# Patient Record
Sex: Female | Born: 1950 | Race: White | Hispanic: No | Marital: Married | Smoking: Never smoker
Health system: Southern US, Community
[De-identification: ages and names within clinical notes are randomized; demographics above are authoritative.]

## PROBLEM LIST (undated history)

## (undated) DIAGNOSIS — K5190 Ulcerative colitis, unspecified, without complications: Secondary | ICD-10-CM

## (undated) DIAGNOSIS — F329 Major depressive disorder, single episode, unspecified: Secondary | ICD-10-CM

## (undated) DIAGNOSIS — M1990 Unspecified osteoarthritis, unspecified site: Secondary | ICD-10-CM

## (undated) DIAGNOSIS — K635 Polyp of colon: Secondary | ICD-10-CM

## (undated) DIAGNOSIS — I10 Essential (primary) hypertension: Secondary | ICD-10-CM

## (undated) DIAGNOSIS — K219 Gastro-esophageal reflux disease without esophagitis: Secondary | ICD-10-CM

## (undated) DIAGNOSIS — F419 Anxiety disorder, unspecified: Secondary | ICD-10-CM

## (undated) DIAGNOSIS — E7800 Pure hypercholesterolemia, unspecified: Secondary | ICD-10-CM

## (undated) DIAGNOSIS — D649 Anemia, unspecified: Secondary | ICD-10-CM

## (undated) DIAGNOSIS — R519 Headache, unspecified: Secondary | ICD-10-CM

## (undated) DIAGNOSIS — T8859XA Other complications of anesthesia, initial encounter: Secondary | ICD-10-CM

## (undated) DIAGNOSIS — G8929 Other chronic pain: Secondary | ICD-10-CM

## (undated) DIAGNOSIS — F32A Depression, unspecified: Secondary | ICD-10-CM

## (undated) HISTORY — DX: Ulcerative colitis, unspecified, without complications: K51.90

## (undated) HISTORY — PX: OTHER SURGICAL HISTORY: SHX169

## (undated) HISTORY — DX: Other chronic pain: G89.29

## (undated) HISTORY — PX: TONSILLECTOMY: SUR1361

## (undated) HISTORY — DX: Polyp of colon: K63.5

## (undated) HISTORY — DX: Anemia, unspecified: D64.9

## (undated) HISTORY — DX: Unspecified osteoarthritis, unspecified site: M19.90

## (undated) HISTORY — PX: SHOULDER SURGERY: SHX246

## (undated) HISTORY — PX: ABDOMINAL HYSTERECTOMY: SHX81

## (undated) HISTORY — DX: Headache, unspecified: R51.9

---

## 1979-11-16 HISTORY — PX: APPENDECTOMY: SHX54

## 1999-11-23 ENCOUNTER — Other Ambulatory Visit: Admission: RE | Admit: 1999-11-23 | Discharge: 1999-11-23 | Payer: Self-pay | Admitting: Obstetrics & Gynecology

## 2000-12-19 ENCOUNTER — Other Ambulatory Visit: Admission: RE | Admit: 2000-12-19 | Discharge: 2000-12-19 | Payer: Self-pay | Admitting: Obstetrics & Gynecology

## 2002-03-19 ENCOUNTER — Other Ambulatory Visit: Admission: RE | Admit: 2002-03-19 | Discharge: 2002-03-19 | Payer: Self-pay | Admitting: Obstetrics & Gynecology

## 2002-04-02 ENCOUNTER — Encounter: Admission: RE | Admit: 2002-04-02 | Discharge: 2002-04-02 | Payer: Self-pay | Admitting: Family Medicine

## 2002-04-02 ENCOUNTER — Encounter: Payer: Self-pay | Admitting: Family Medicine

## 2003-04-29 ENCOUNTER — Other Ambulatory Visit: Admission: RE | Admit: 2003-04-29 | Discharge: 2003-04-29 | Payer: Self-pay | Admitting: Obstetrics & Gynecology

## 2004-09-28 ENCOUNTER — Other Ambulatory Visit: Admission: RE | Admit: 2004-09-28 | Discharge: 2004-09-28 | Payer: Self-pay | Admitting: Obstetrics and Gynecology

## 2005-03-29 ENCOUNTER — Encounter: Admission: RE | Admit: 2005-03-29 | Discharge: 2005-03-29 | Payer: Self-pay | Admitting: Family Medicine

## 2005-08-23 ENCOUNTER — Encounter: Admission: RE | Admit: 2005-08-23 | Discharge: 2005-08-23 | Payer: Self-pay | Admitting: Family Medicine

## 2005-08-23 ENCOUNTER — Ambulatory Visit: Payer: Self-pay | Admitting: Emergency Medicine

## 2005-08-30 ENCOUNTER — Encounter: Admission: RE | Admit: 2005-08-30 | Discharge: 2005-08-30 | Payer: Self-pay | Admitting: Family Medicine

## 2005-09-20 ENCOUNTER — Ambulatory Visit: Payer: Self-pay | Admitting: Emergency Medicine

## 2005-10-11 ENCOUNTER — Ambulatory Visit: Payer: Self-pay | Admitting: Emergency Medicine

## 2005-12-06 ENCOUNTER — Ambulatory Visit: Payer: Self-pay | Admitting: Emergency Medicine

## 2006-08-29 ENCOUNTER — Encounter: Admission: RE | Admit: 2006-08-29 | Discharge: 2006-08-29 | Payer: Self-pay | Admitting: Obstetrics & Gynecology

## 2007-03-18 ENCOUNTER — Encounter: Admission: RE | Admit: 2007-03-18 | Discharge: 2007-03-18 | Payer: Self-pay | Admitting: Family Medicine

## 2007-11-27 ENCOUNTER — Encounter: Admission: RE | Admit: 2007-11-27 | Discharge: 2007-11-27 | Payer: Self-pay | Admitting: Family Medicine

## 2007-12-04 ENCOUNTER — Encounter: Admission: RE | Admit: 2007-12-04 | Discharge: 2007-12-04 | Payer: Self-pay | Admitting: Family Medicine

## 2009-01-14 ENCOUNTER — Inpatient Hospital Stay (HOSPITAL_COMMUNITY): Admission: EM | Admit: 2009-01-14 | Discharge: 2009-01-17 | Payer: Self-pay | Admitting: Emergency Medicine

## 2009-01-15 ENCOUNTER — Encounter (INDEPENDENT_AMBULATORY_CARE_PROVIDER_SITE_OTHER): Payer: Self-pay | Admitting: Emergency Medicine

## 2009-01-15 ENCOUNTER — Ambulatory Visit: Payer: Self-pay | Admitting: Surgery

## 2009-06-09 ENCOUNTER — Encounter: Admission: RE | Admit: 2009-06-09 | Discharge: 2009-06-09 | Payer: Self-pay | Admitting: Obstetrics & Gynecology

## 2011-02-25 LAB — COMPREHENSIVE METABOLIC PANEL
ALT: 21 U/L (ref 0–35)
Albumin: 3.2 g/dL — ABNORMAL LOW (ref 3.5–5.2)
Albumin: 3.6 g/dL (ref 3.5–5.2)
Alkaline Phosphatase: 54 U/L (ref 39–117)
Alkaline Phosphatase: 58 U/L (ref 39–117)
BUN: 10 mg/dL (ref 6–23)
BUN: 10 mg/dL (ref 6–23)
Calcium: 8.5 mg/dL (ref 8.4–10.5)
Chloride: 95 mEq/L — ABNORMAL LOW (ref 96–112)
Creatinine, Ser: 0.85 mg/dL (ref 0.4–1.2)
GFR calc non Af Amer: >60 mL/min (ref >60)
Glucose, Bld: 89 mg/dL (ref 70–99)
Potassium: 3.5 mEq/L (ref 3.5–5.1)
Sodium: 136 mEq/L (ref 135–145)
Total Bilirubin: 0.4 mg/dL (ref 0.3–1.2)
Total Protein: 5.6 g/dL — ABNORMAL LOW (ref 6.0–8.3)

## 2011-02-25 LAB — BASIC METABOLIC PANEL
BUN: 11 mg/dL (ref 6–23)
BUN: 13 mg/dL (ref 6–23)
CO2: 28 mEq/L (ref 19–32)
CO2: 30 mEq/L (ref 19–32)
Calcium: 9.3 mg/dL (ref 8.4–10.5)
Chloride: 97 mEq/L (ref 96–112)
Creatinine, Ser: 1.41 mg/dL — ABNORMAL HIGH (ref 0.4–1.2)
GFR calc Af Amer: >60 mL/min (ref >60)
GFR calc non Af Amer: 38 mL/min — ABNORMAL LOW (ref >60)
Glucose, Bld: 108 mg/dL — ABNORMAL HIGH (ref 70–99)
Glucose, Bld: 76 mg/dL (ref 70–99)
Potassium: 3.6 mEq/L (ref 3.5–5.1)
Potassium: 4 mEq/L (ref 3.5–5.1)
Potassium: 4.1 mEq/L (ref 3.5–5.1)
Sodium: 128 mEq/L — ABNORMAL LOW (ref 135–145)

## 2011-02-25 LAB — LIPID PANEL

## 2011-02-25 LAB — URINALYSIS, ROUTINE W REFLEX MICROSCOPIC
Bilirubin Urine: NEGATIVE
Hgb urine dipstick: NEGATIVE
Ketones, ur: NEGATIVE mg/dL
Protein, ur: NEGATIVE mg/dL
Urobilinogen, UA: 0.2 mg/dL (ref 0.0–1.0)

## 2011-02-25 LAB — DIFFERENTIAL
Basophils Absolute: 0 10*3/uL (ref 0.0–0.1)
Basophils Relative: 1 % (ref 0–1)
Monocytes Absolute: 0.5 10*3/uL (ref 0.1–1.0)
Neutro Abs: 5.2 10*3/uL (ref 1.7–7.7)

## 2011-02-25 LAB — CK TOTAL AND CKMB (NOT AT ARMC)
CK, MB: 2.3 ng/mL (ref 0.3–4.0)
Relative Index: 1.3 (ref 0.0–2.5)

## 2011-02-25 LAB — CBC
HCT: 31.5 % — ABNORMAL LOW (ref 36.0–46.0)
HCT: 32.2 % — ABNORMAL LOW (ref 36.0–46.0)
Hemoglobin: 11.2 g/dL — ABNORMAL LOW (ref 12.0–15.0)
Hemoglobin: 11.4 g/dL — ABNORMAL LOW (ref 12.0–15.0)
Hemoglobin: 14.3 g/dL (ref 12.0–15.0)
MCHC: 34.4 g/dL (ref 30.0–36.0)
MCV: 88.5 fL (ref 78.0–100.0)
RBC: 3.57 MIL/uL — ABNORMAL LOW (ref 3.87–5.11)
RBC: 3.67 MIL/uL — ABNORMAL LOW (ref 3.87–5.11)
RDW: 12.5 % (ref 11.5–15.5)
RDW: 13.7 % (ref 11.5–15.5)
WBC: 5.9 10*3/uL (ref 4.0–10.5)

## 2011-02-25 LAB — CARDIAC PANEL(CRET KIN+CKTOT+MB+TROPI)
CK, MB: 1.7 ng/mL (ref 0.3–4.0)
CK, MB: 1.7 ng/mL (ref 0.3–4.0)
Relative Index: 1.3 (ref 0.0–2.5)
Total CK: 131 U/L (ref 7–177)

## 2011-02-25 LAB — POCT CARDIAC MARKERS
CKMB, poc: 1.1 ng/mL (ref 1.0–8.0)
CKMB, poc: <1 ng/mL — ABNORMAL LOW (ref 1.0–8.0)
Troponin i, poc: <0.05 ng/mL (ref 0.00–0.09)
Troponin i, poc: <0.05 ng/mL (ref 0.00–0.09)
Troponin i, poc: <0.05 ng/mL (ref 0.00–0.09)

## 2011-02-25 LAB — TROPONIN I

## 2011-02-25 LAB — CORTISOL

## 2011-02-25 LAB — HEPATIC FUNCTION PANEL
Albumin: 3.8 g/dL (ref 3.5–5.2)
Total Protein: 6.5 g/dL (ref 6.0–8.3)

## 2011-02-25 LAB — OSMOLALITY, URINE: Osmolality, Ur: 262 mOsm/kg — ABNORMAL LOW (ref 390–1090)

## 2011-03-30 NOTE — Discharge Summary (Signed)
Erika Luna, Erika Luna         ACCOUNT NO.:  [id number]   MEDICAL RECORD NO.:  [id number]          PATIENT TYPE:  INP   LOCATION:  6523                         FACILITY:  [facility]   PHYSICIAN:  Sheila Oats, M.D.DATE OF BIRTH:  11/06/1951   DATE OF ADMISSION:  01/14/2009  DATE OF DISCHARGE:  01/17/2009                               DISCHARGE SUMMARY   DISCHARGE DIAGNOSES:  1. Presyncope, likely secondary to orthostasis.  2. Hyponatremia, likely secondary to hydrochlorothiazide, improved.  3. Acute renal insufficiency, likely secondary to volume depletion,      resolved.  4. Mild/relative hypoglycemia, resolved.  Workup negative.  5. History of anxiety/depression.  6. Hypertension.  7. History of hyperlipidemia.   PROCEDURES/STUDIES:  Carotid Doppler ultrasound, no ICA stenosis.   A 2D echocardiogram.  Ejection fraction 60% to 65%.  No left ventricular  wall motion abnormalities.   A serum cortisol level is 23.9, and TSH is within normal limits at  1.209.   BRIEF HISTORY:  The patient is a 60 year old white female with the above-  listed medical problems who presented with complaints of presyncope and  chest pain.  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  She stated that she felt like she would pass  out and so went and sat down in a chair.  She also reported chest pain  that lasted about 20 minutes.  It was mid sternal in location, 3 to 4  out of 10 in intensity.  Described as a tightness.  She was admitted for  further evaluation and management.   Please see the full admission history and physical dictated on January 14, 2009 for the details of the admission physical examination as well as  the laboratory data.   HOSPITAL COURSE:  1. Presyncope:  Upon admission, the patient had serial cardiac enzymes      done and was placed on aspirin.  The serial cardiac enzymes  were      negative for MI, she was monitored on telemetry and no arrhythmias      reported.  She also had orthostatic vital signs done on admission.      She was orthostatic by pulse and also complained of dizziness when      she sat up at that time.  Her HCTZ was discontinued, and she was      hydrated with IV fluids.  Follow-up recheck orthostatics were done,      and these were within normal limits.  Further evaluation included      carotid Doppler ultrasound and a 2D echocardiogram, and the results      as stated above, within normal limits.  The impression was that the      patient's presyncope was secondary to orthostasis.  Her symptoms      have resolved, and she will be discharged to follow up with her      primary care physician.  She has been instructed to discontinue      HCTZ as well as Norvasc.  Her blood pressures  were well controlled      on Benicar alone during her hospital stay.  The patient also was      complaining of chest pain on admission.  She had serial cardiac      enzymes done, as already mentioned.  A 2D echocardiogram was also      done with no wall motion abnormality.  The patient's chest pain      resolved.  Impression was that this was likely secondary to GERD,      which she has a history of, and she is to continue her PPI on      discharge.  2. Hyponatremia:  On followup, the patient's sodium was noted to be      low at 128.  Workup included a serum cortisol as well as a TSH, and      this was within normal limits.  It was noted that the patient had      come in on HCTZ and was orthostatic, as already discussed above.      She was monitored.  Her sodium subsequently rechecked, and it had      improved to 131 today.  As noted above, her HCTZ has been      discontinued.  3. Acute renal insufficiency:  Her creatinine upon admission was 1.41      with hydration.  Her creatinine has improved to 0.95.  The      impression was that this was secondary to volume  depletion.  4. Hypertension:  As already mentioned above, her HCTZ and Norvasc      were held throughout her hospital stay.  Her blood pressures have      been well controlled on just the Benicar, and she is to continue      this upon discharge and follow up with her primary care physician      for further monitoring of her blood pressures and to consider      restarting Norvasc in the future, if needed on outpatient      monitoring of optimal blood pressure control.  5. History of hyperlipidemia:  Patient is to continue outpatient      medications.  6. GERD:  Patient is to continue her PPI upon discharge.   DISCHARGE MEDICATIONS:  1. Aciphex 20 mg p.o. b.i.d.  2. Benicar 20 mg daily.  3. Clorazepate 7.5 mg b.i.d.  4. Fish oil.  5. Flonase p.r.n.  6. Trazodone 100 mg p.o. q.h.s..  7. Vitamin C.  8. Vitamin D.  9. Wellbutrin 300 mg p.o. daily.   Patient is instructed to discontinue HCTZ and Norvasc, as above.   DISCHARGE CONDITION:  Improved/stable.   FOLLOW-UP CARE:  Dr. Darcus Austin in 1 to 2 weeks.   DISCHARGE CONDITION:  Improved/stable.      Sheila Oats, M.D.  Electronically Signed     ACV/MEDQ  D:  01/17/2009  T:  01/17/2009  Job:  [id number]   cc:   Frann Rider, M.D.

## 2011-03-30 NOTE — H&P (Signed)
Erika Luna, Erika Luna         ACCOUNT NO.:  [id number]   MEDICAL RECORD NO.:  [id number]          PATIENT TYPE:  INP   LOCATION:  1827                         FACILITY:  [facility]   PHYSICIAN:  Sheila Oats, M.D.DATE OF BIRTH:  Jul 05, 1951   DATE OF ADMISSION:  01/14/2009  DATE OF DISCHARGE:                              HISTORY & PHYSICAL   PRIMARY CARE PHYSICIAN:  Frann Rider, M.D.   CHIEF COMPLAINT:  Presyncope and chest pain.   HISTORY OF PRESENT ILLNESS:  The patient is a 60 year old white female  with past medical history significant for hypertension, hyperlipidemia,  GERD, anxiety and depression who presents with above complaints.  She  states that she was in her usual state of health until last p.m. when  she began experiencing mild chest pain in her midsternal area and she  just thought it was the because of the position that she was in and did  not pay too much attention to it and subsequently went to sleep.  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  She  felt like she was going to pass out and so she went and sat in a chair.  She asked for a  nurse to be called and they came over and laid her on  the floor.  She denies any loss of consciousness.  She states that when  the nurse came by she gave her four aspirins.  EMS was called and per  patient report her blood pressure was low but no documentation seen in  the ED records as to what the blood per blood pressure on the scene was.  She states that shortly after she began feeling dizzy.  She also  developed chest pain - described as midsternal in location and 3-4/10 in  intensity 20 minutes in duration prior to her arrival to the ED.  She  does not recall any alleviating or precipitating factors.  At the time  of this interview she states that  the chest tightness is resolved but  now she just feels some heaviness in her chest.  She denies associated  nausea or vomiting, also denies radiation and no shortness of breath.   She was seen in the ED and point of care markers negative times one.  Her EKG shows normal sinus rhythm at a rate of 84 with no acute ischemic  changes, chest x-ray shows no active cardiopulmonary disease.  She  states that she has had a nonproductive cough for the past 3-4 days,  denies fevers, also denies dysuria and no PND.  She is admitted for  further evaluation and management.   PAST MEDICAL HISTORY:  As above.  History of allergies.   MEDICATIONS:  1. Aciphex 20 mg p.o. b.i.d.  2. Benicar are 20 mg daily.  3. Clorazepate 7.5 mg b.i.d.  4. Fish oil.  5. Flonase p.r.n.  6. Hydrochlorothiazide 12.5 mg p.o. daily.  7. Norvasc 5 mg daily.  8. Trazodone 100 mg p.o. q.h.s.  9. Vitamin C.  10.Vitamin D.  11.Wellbutrin 300 mg p.o. daily.   ALLERGIES:  CODEINE AND ERYTHROMYCIN.   SOCIAL HISTORY:  The patient denies tobacco, also denies alcohol.   FAMILY HISTORY:  Her brother and dad had diabetes and her father had a  mild heart attack at age 87 and history of irregular heart been in his  60s.  Her aunt has breast cancer.   REVIEW OF SYSTEMS:  As per HPI, other review of systems negative.   PHYSICAL EXAMINATION:  In general the patient is a pleasant older white  female, she is alert and appropriate in no apparent distress.  VITAL SIGNS:  Her temperature is 97.5, blood pressure 138/88, initially  147 over 94, pulse is 87 with a respiratory rate of 19, O2 sat of 99%.  HEENT:  PERRL, EOMI, mildly erythematous conjunctiva bilaterally, no  drainage, slightly dry mucous membranes and no oral exudates.  NECK:  Supple, no adenopathy, no thyromegaly, no JVD and no carotid  bruits appreciated.  LUNGS:  Clear to auscultation bilaterally.  No crackles or wheezes.  CARDIOVASCULAR:  Regular rate and rhythm.   Normal S1-S2.  No S4 and no  S3 appreciated.  ABDOMEN:  Soft, bowel sounds present, mild epigastric tenderness and no  rebound tenderness, no organomegaly and no masses palpable.  EXTREMITIES:  No cyanosis and no edema.  NEUROLOGIC:  She is alert and oriented x3, no facial asymmetry.  Cranial  nerves II-XII grossly intact.  Sensory grossly intact.  Nonfocal exam.   LABORATORY DATA:  As per HPI.  Also her white cell count is 7.3 with a  hemoglobin of 14.3, hematocrit of 41.5, platelet count 178, neutrophil  count 72%.  Urinalysis is negative for infection.  Sodium is 138 with a  potassium of 3.6, chloride is 105, CO2 24, glucose is 76, BUN is 11 with  a creatinine of 1.41, calcium of 9.3.   ASSESSMENT AND PLAN:  Problem #1.  Presyncope - will obtain orthostatic  vital signs, check cardiac enzymes and a 2-D echocardiogram as well as  carotid Doppler ultrasound.  She has a relative hypoglycemia - blood  glucose of 76 in the ED where she was asymptomatic with this, at the  scene EMS documented a blood glucose of 112.  She has no focal  neurologic findings.  She states as above that she had a low blood  pressure at the scene but not seen in the ED records.  Again, we will  follow the orthostatic vital signs as above and monitor her blood  pressures, follow above studies and further treat accordingly pending  results.  Problem #2.  Chest pain - will obtain serial cardiac enzymes, place on  aspirin and p.r.n. nitroglycerin.  Will also had a PPI to cover  GI  etiology and she has a history of gastroesophageal reflux disease.  Follow and consult cardiology pending cardiac enzymes.  We will also  continue Tranxene and she has a history of anxiety.  Problem #4.  Hypoglycemia, relative - as discussed above in the ED and  patient with no further presyncopal symptoms at that time, and it is  noted that her blood glucose at the scene was 112.  Will obtain LFTs,  follow, recheck blood glucose and  manage accordingly.  Problem #5. Acute renal insufficiency - will hold HCTZ, check  orthostatics, hydrate, follow and recheck.  Problem #6.  Gastroesophageal reflux disease- place on PPI.  Problem #7.  Hypertension - continue lisinopril, hold parameters, hold  Norvasc and HCTZ for now and resume  when clinically appropriate.  Problem #8.  History of anxiety, depression - as above.  Continue  outpatient medications.  Problem #9.  History of hyperlipidemia - check fasting lipid profile,  follow and continue outpatient medications.      Sheila Oats, M.D.  Electronically Signed     ACV/MEDQ  D:  01/14/2009  T:  01/14/2009  Job:  [id number]   cc:   Frann Rider, M.D.

## 2011-12-27 DIAGNOSIS — F411 Generalized anxiety disorder: Secondary | ICD-10-CM | POA: Insufficient documentation

## 2011-12-27 DIAGNOSIS — F32A Depression, unspecified: Secondary | ICD-10-CM | POA: Insufficient documentation

## 2012-03-30 ENCOUNTER — Emergency Department (HOSPITAL_COMMUNITY)
Admission: EM | Admit: 2012-03-30 | Discharge: 2012-03-30 | Disposition: A | Discharge status: Home / Self Care | Payer: BC Managed Care – PPO | Attending: Emergency Medicine | Admitting: Emergency Medicine

## 2012-03-30 ENCOUNTER — Emergency Department (HOSPITAL_COMMUNITY): Payer: BC Managed Care – PPO

## 2012-03-30 ENCOUNTER — Encounter (HOSPITAL_COMMUNITY): Payer: Self-pay | Admitting: *Deleted

## 2012-03-30 DIAGNOSIS — R079 Chest pain, unspecified: Secondary | ICD-10-CM | POA: Insufficient documentation

## 2012-03-30 DIAGNOSIS — R51 Headache: Secondary | ICD-10-CM

## 2012-03-30 DIAGNOSIS — R112 Nausea with vomiting, unspecified: Secondary | ICD-10-CM | POA: Insufficient documentation

## 2012-03-30 DIAGNOSIS — R5381 Other malaise: Secondary | ICD-10-CM | POA: Insufficient documentation

## 2012-03-30 DIAGNOSIS — E7800 Pure hypercholesterolemia, unspecified: Secondary | ICD-10-CM | POA: Insufficient documentation

## 2012-03-30 DIAGNOSIS — R1031 Right lower quadrant pain: Secondary | ICD-10-CM | POA: Insufficient documentation

## 2012-03-30 DIAGNOSIS — I10 Essential (primary) hypertension: Secondary | ICD-10-CM | POA: Insufficient documentation

## 2012-03-30 HISTORY — DX: Depression, unspecified: F32.A

## 2012-03-30 HISTORY — DX: Pure hypercholesterolemia, unspecified: E78.00

## 2012-03-30 HISTORY — DX: Major depressive disorder, single episode, unspecified: F32.9

## 2012-03-30 HISTORY — DX: Essential (primary) hypertension: I10

## 2012-03-30 LAB — URINALYSIS, ROUTINE W REFLEX MICROSCOPIC
Bilirubin Urine: NEGATIVE
Glucose, UA: NEGATIVE mg/dL
Hgb urine dipstick: NEGATIVE
Ketones, ur: NEGATIVE mg/dL
Leukocytes, UA: NEGATIVE
pH: 7.5 (ref 5.0–8.0)

## 2012-03-30 LAB — DIFFERENTIAL
Basophils Relative: 0 % (ref 0–1)
Monocytes Absolute: 0.7 10*3/uL (ref 0.1–1.0)
Monocytes Relative: 13 % — ABNORMAL HIGH (ref 3–12)
Neutro Abs: 4.4 10*3/uL (ref 1.7–7.7)

## 2012-03-30 LAB — CBC
HCT: 34.5 % — ABNORMAL LOW (ref 36.0–46.0)
Hemoglobin: 11.7 g/dL — ABNORMAL LOW (ref 12.0–15.0)
MCHC: 33.9 g/dL (ref 30.0–36.0)
MCV: 86.5 fL (ref 78.0–100.0)

## 2012-03-30 LAB — COMPREHENSIVE METABOLIC PANEL
Albumin: 3.7 g/dL (ref 3.5–5.2)
BUN: 23 mg/dL (ref 6–23)
CO2: 23 mEq/L (ref 19–32)
Chloride: 96 mEq/L (ref 96–112)
Creatinine, Ser: 0.84 mg/dL (ref 0.50–1.10)
GFR calc non Af Amer: 74 mL/min — ABNORMAL LOW (ref >90)
Total Bilirubin: 0.2 mg/dL — ABNORMAL LOW (ref 0.3–1.2)

## 2012-03-30 LAB — LIPASE, BLOOD: Lipase: 28 U/L (ref 11–59)

## 2012-03-30 LAB — TROPONIN I: Troponin I: <0.3 ng/mL (ref <0.30)

## 2012-03-30 MED ORDER — ONDANSETRON HCL 4 MG/2ML IJ SOLN
4.0000 mg | Freq: Once | INTRAMUSCULAR | Status: AC
  Administered 2012-03-30: 4 mg via INTRAVENOUS
  Filled 2012-03-30: qty 2

## 2012-03-30 MED ORDER — SODIUM CHLORIDE 0.9 % IV SOLN: INTRAVENOUS | Status: DC

## 2012-03-30 MED ORDER — IOHEXOL 300 MG/ML  SOLN
125.0000 mL | Freq: Once | INTRAMUSCULAR | Status: AC | PRN
  Administered 2012-03-30: 125 mL via INTRAVENOUS

## 2012-03-30 MED ORDER — HYDROMORPHONE HCL 2 MG PO TABS: 2.0000 mg | ORAL_TABLET | ORAL | Status: AC | PRN

## 2012-03-30 MED ORDER — SODIUM CHLORIDE 0.9 % IV BOLUS (SEPSIS): 250.0000 mL | Freq: Once | INTRAVENOUS | Status: DC

## 2012-03-30 MED ORDER — HYDROMORPHONE HCL PF 1 MG/ML IJ SOLN
1.0000 mg | Freq: Once | INTRAMUSCULAR | Status: AC
  Administered 2012-03-30: 1 mg via INTRAVENOUS
  Filled 2012-03-30: qty 1

## 2012-03-30 NOTE — ED Notes (Signed)
[id number]> Expected date:<BR> Expected time: 1:02 PM<BR> Means of arrival:<BR> Comments:<BR> M11 - 61yoF Abd pain, diaphoretic

## 2012-03-30 NOTE — ED Provider Notes (Signed)
History     CSN: [id number]  Arrival date & time 03/30/12  24   First MD Initiated Contact with Patient 03/30/12 1302      Chief Complaint  Patient presents with  . Abdominal Pain  . Weakness    (Consider location/radiation/quality/duration/timing/severity/associated sxs/prior treatment) The history is provided by the patient.   patient is a 61 year old female came in by EMS from work patient had the onset of pain between her shoulder blades around 9 or 9:30 this morning got more intense right before arrival patient never had any anterior chest pain no shortness of breath vital signs by EMS with a blood pressure 124/78 pulse was 95 and blood sugar was 124 patient felt as if she was going to pass out she also then developed pain in the back of her head this occurred shortly before arrival and she also developed right lower quadrant abdominal pain. Similar episode 3 years ago but not as bad etiology that was never identified. Patient does have a history of depression anxiety. Currently the main pain is in the back of her head as a 10 out of 10 and the right lower quadrant pain is about an 8/10 and the pain between the shoulder blades has resolved. Pain was described as sharp in all places not associated with any nausea or vomiting however yesterday patient vomited once or twice. Patient did ask to pass out but felt like she was going to.  Past Medical History  Diagnosis Date  . Hypertension   . Depression   . Hypercholesteremia     History reviewed. No pertinent past surgical history.  No family history on file.  History  Substance Use Topics  . Smoking status: Not on file  . Smokeless tobacco: Not on file  . Alcohol Use:     OB History    Grav Para Term Preterm Abortions TAB SAB Ect Mult Living                  Review of Systems  Constitutional: Negative for fever and chills.  HENT: Negative for congestion and neck pain.   Eyes: Negative for redness and visual  disturbance.  Respiratory: Negative for cough, shortness of breath and wheezing.   Cardiovascular: Negative for chest pain.  Gastrointestinal: Positive for nausea, vomiting and abdominal pain. Negative for diarrhea.  Genitourinary: Negative for dysuria.  Musculoskeletal: Positive for back pain.  Skin: Negative for rash.  Neurological: Positive for weakness and headaches. Negative for syncope.  Hematological: Does not bruise/bleed easily.    Allergies  Azithromycin; Codeine; and Lisinopril  Home Medications   Current Outpatient Rx  Name Route Sig Dispense Refill  . AMLODIPINE BESYLATE 10 MG PO TABS Oral Take 10 mg by mouth daily.    . ASPIRIN 81 MG PO CHEW Oral Chew 81 mg by mouth daily.    . BUPROPION HCL ER (XL) 300 MG PO TB24 Oral Take 300 mg by mouth daily.    Marland Kitchen VITAMIN D 1000 UNITS PO TABS Oral Take 2,000 Units by mouth daily.    Marland Kitchen CLORAZEPATE DIPOTASSIUM 7.5 MG PO TABS Oral Take 7.5 mg by mouth 2 (two) times daily as needed.    Marland Kitchen CRANBERRY 600 MG PO TABS Oral Take 2 tablets by mouth daily.    Marland Kitchen FLUTICASONE PROPIONATE 50 MCG/ACT NA SUSP Nasal Place 1 spray into the nose daily.    Marland Kitchen OLMESARTAN MEDOXOMIL 40 MG PO TABS Oral Take 40 mg by mouth daily.    Marland Kitchen RABEPRAZOLE  SODIUM 20 MG PO TBEC Oral Take 20 mg by mouth daily.    Marland Kitchen RALOXIFENE HCL 60 MG PO TABS Oral Take 60 mg by mouth daily.    . RED YEAST RICE EXTRACT 600 MG PO CAPS Oral Take 1,200 capsules by mouth daily.    . TRAZODONE HCL 100 MG PO TABS Oral Take 100 mg by mouth.    Marland Kitchen VITAMIN C 500 MG PO TABS Oral Take 500 mg by mouth daily.    Marland Kitchen VITAMIN E 400 UNITS PO CAPS Oral Take 400 Units by mouth daily.    Marland Kitchen HYDROMORPHONE HCL 2 MG PO TABS Oral Take 1 tablet (2 mg total) by mouth every 4 (four) hours as needed for pain. 30 tablet 0    BP 134/82  Pulse 102  Temp(Src) 98.2 F (36.8 C) (Oral)  SpO2 100%  Physical Exam  Nursing note and vitals reviewed. Constitutional: She is oriented to person, place, and time. She appears  well-developed and well-nourished. No distress.  HENT:  Head: Normocephalic and atraumatic.  Right Ear: External ear normal.  Left Ear: External ear normal.  Mouth/Throat: Oropharynx is clear and moist.  Eyes: Conjunctivae and EOM are normal. Pupils are equal, round, and reactive to light.  Neck: Normal range of motion. Neck supple.  Cardiovascular: Normal rate, regular rhythm and normal heart sounds.   No murmur heard. Pulmonary/Chest: Effort normal and breath sounds normal. No respiratory distress. She has no wheezes. She has no rales. She exhibits no tenderness.  Abdominal: Soft. Bowel sounds are normal. There is tenderness. There is no rebound and no guarding.       Tender right lower quadrant  Musculoskeletal: Normal range of motion. She exhibits no edema and no tenderness.  Neurological: She is alert and oriented to person, place, and time. No cranial nerve deficit. She exhibits normal muscle tone. Coordination normal.  Skin: Skin is warm. No rash noted. No erythema.    ED Course  Procedures (including critical care time)  Labs Reviewed  URINALYSIS, ROUTINE W REFLEX MICROSCOPIC - Abnormal; Notable for the following:    APPearance CLOUDY (*)    All other components within normal limits  CBC - Abnormal; Notable for the following:    Hemoglobin 11.7 (*)    HCT 34.5 (*)    All other components within normal limits  DIFFERENTIAL - Abnormal; Notable for the following:    Neutrophils Relative 78 (*)    Lymphocytes Relative 8 (*)    Lymphs Abs 0.5 (*)    Monocytes Relative 13 (*)    All other components within normal limits  COMPREHENSIVE METABOLIC PANEL - Abnormal; Notable for the following:    Sodium 131 (*)    Glucose, Bld 117 (*)    Total Bilirubin 0.2 (*)    GFR calc non Af Amer 74 (*)    GFR calc Af Amer 86 (*)    All other components within normal limits  LIPASE, BLOOD  TROPONIN I  TROPONIN I   Dg Chest 2 View  03/30/2012  *RADIOLOGY REPORT*  Clinical Data: Chest  pain.  CHEST - 2 VIEW  Comparison: 01/14/2009  Findings: Heart and mediastinal contours are within normal limits. No focal opacities or effusions.  No acute bony abnormality.  IMPRESSION: No active cardiopulmonary disease.  Original Report Authenticated By: Raelyn Number, M.D.   Ct Head Wo Contrast  03/30/2012  *RADIOLOGY REPORT*  Clinical Data: 61 year old female with headache and diaphoresis.  CT HEAD WITHOUT CONTRAST  Technique:  Contiguous axial images were obtained from the base of the skull through the vertex without contrast.  Comparison: None  Findings: No acute intracranial abnormalities are identified, including mass lesion or mass effect, hydrocephalus, extra-axial fluid collection, midline shift, hemorrhage, or acute infarction.  The visualized bony calvarium is unremarkable.  IMPRESSION: No evidence of acute intracranial abnormality.  Original Report Authenticated By: Lura Em, M.D.   Ct Angio Chest W/cm &/or Wo Cm  03/30/2012  *RADIOLOGY REPORT*  Clinical Data:  Chest, abdominal and pelvic pain with weakness.  CT ANGIOGRAPHY CHEST CT ABDOMEN AND PELVIS WITH CONTRAST  Technique:  Multidetector CT imaging of the chest was performed using the standard protocol during bolus administration of intravenous contrast.  Multiplanar CT image reconstructions including MIPs were obtained to evaluate the vascular anatomy. Multidetector CT imaging of the abdomen and pelvis was performed using the standard protocol during bolus administration of intravenous contrast.  Contrast: 164m OMNIPAQUE IOHEXOL 300 MG/ML  SOLN  Comparison:  03/18/2007 abdominal/pelvic CT.  CTA CHEST  Findings:  This study is technically adequate.  No pulmonary emboli are identified. There is no evidence of thoracic aortic aneurysm or dissection. The heart and great vessels are within normal limits.  No pleural or pericardial effusions are present. No enlarged lymph nodes are identified.  The lungs are clear except for minimal  dependent and basilar atelectasis. There is no evidence of airspace disease, consolidation, nodule, mass or endobronchial/endotracheal lesion.  No acute or suspicious bony abnormalities are identified.   Review of the MIP images confirms the above findings.  IMPRESSION: No evidence of acute abnormality - no evidence of pulmonary emboli or thoracic aortic aneurysm/dissection.  CT ABDOMEN AND PELVIS  Findings: The liver is within normal limits except for a probable tiny right hepatic cyst. The spleen, pancreas, gallbladder, adrenal glands and kidneys are unremarkable except for bilateral renal cysts and moderate bilateral renal cortical thinning. No free fluid, enlarged lymph nodes, biliary dilation or abdominal aortic aneurysm identified.  No bowel abnormalities are identified. The patient is status post hysterectomy. The bladder is within normal limits. No acute or suspicious bony abnormalities are identified.  Review of the MIP images confirms the above findings.  IMPRESSION: No evidence of acute abnormality.  Moderate bilateral renal cortical thinning / atrophy and renal cysts.  Original Report Authenticated By: JLura Em M.D.   Ct Abdomen Pelvis W Contrast  03/30/2012  *RADIOLOGY REPORT*  Clinical Data:  Chest, abdominal and pelvic pain with weakness.  CT ANGIOGRAPHY CHEST CT ABDOMEN AND PELVIS WITH CONTRAST  Technique:  Multidetector CT imaging of the chest was performed using the standard protocol during bolus administration of intravenous contrast.  Multiplanar CT image reconstructions including MIPs were obtained to evaluate the vascular anatomy. Multidetector CT imaging of the abdomen and pelvis was performed using the standard protocol during bolus administration of intravenous contrast.  Contrast: 1232mOMNIPAQUE IOHEXOL 300 MG/ML  SOLN  Comparison:  03/18/2007 abdominal/pelvic CT.  CTA CHEST  Findings:  This study is technically adequate.  No pulmonary emboli are identified. There is no evidence  of thoracic aortic aneurysm or dissection. The heart and great vessels are within normal limits.  No pleural or pericardial effusions are present. No enlarged lymph nodes are identified.  The lungs are clear except for minimal dependent and basilar atelectasis. There is no evidence of airspace disease, consolidation, nodule, mass or endobronchial/endotracheal lesion.  No acute or suspicious bony abnormalities are identified.   Review of the MIP images confirms the  above findings.  IMPRESSION: No evidence of acute abnormality - no evidence of pulmonary emboli or thoracic aortic aneurysm/dissection.  CT ABDOMEN AND PELVIS  Findings: The liver is within normal limits except for a probable tiny right hepatic cyst. The spleen, pancreas, gallbladder, adrenal glands and kidneys are unremarkable except for bilateral renal cysts and moderate bilateral renal cortical thinning. No free fluid, enlarged lymph nodes, biliary dilation or abdominal aortic aneurysm identified.  No bowel abnormalities are identified. The patient is status post hysterectomy. The bladder is within normal limits. No acute or suspicious bony abnormalities are identified.  Review of the MIP images confirms the above findings.  IMPRESSION: No evidence of acute abnormality.  Moderate bilateral renal cortical thinning / atrophy and renal cysts.  Original Report Authenticated By: Lura Em, M.D.   Results for orders placed during the hospital encounter of 03/30/12  URINALYSIS, ROUTINE W REFLEX MICROSCOPIC      Component Value Range   Color, Urine YELLOW  YELLOW    APPearance CLOUDY (*) CLEAR    Specific Gravity, Urine 1.009  1.005 - 1.030    pH 7.5  5.0 - 8.0    Glucose, UA NEGATIVE  NEGATIVE (mg/dL)   Hgb urine dipstick NEGATIVE  NEGATIVE    Bilirubin Urine NEGATIVE  NEGATIVE    Ketones, ur NEGATIVE  NEGATIVE (mg/dL)   Protein, ur NEGATIVE  NEGATIVE (mg/dL)   Urobilinogen, UA 0.2  0.0 - 1.0 (mg/dL)   Nitrite NEGATIVE  NEGATIVE     Leukocytes, UA NEGATIVE  NEGATIVE   CBC      Component Value Range   WBC 5.6  4.0 - 10.5 (K/uL)   RBC 3.99  3.87 - 5.11 (MIL/uL)   Hemoglobin 11.7 (*) 12.0 - 15.0 (g/dL)   HCT 34.5 (*) 36.0 - 46.0 (%)   MCV 86.5  78.0 - 100.0 (fL)   MCH 29.3  26.0 - 34.0 (pg)   MCHC 33.9  30.0 - 36.0 (g/dL)   RDW 13.0  11.5 - 15.5 (%)   Platelets 258  150 - 400 (K/uL)  DIFFERENTIAL      Component Value Range   Neutrophils Relative 78 (*) 43 - 77 (%)   Neutro Abs 4.4  1.7 - 7.7 (K/uL)   Lymphocytes Relative 8 (*) 12 - 46 (%)   Lymphs Abs 0.5 (*) 0.7 - 4.0 (K/uL)   Monocytes Relative 13 (*) 3 - 12 (%)   Monocytes Absolute 0.7  0.1 - 1.0 (K/uL)   Eosinophils Relative 0  0 - 5 (%)   Eosinophils Absolute 0.0  0.0 - 0.7 (K/uL)   Basophils Relative 0  0 - 1 (%)   Basophils Absolute 0.0  0.0 - 0.1 (K/uL)  COMPREHENSIVE METABOLIC PANEL      Component Value Range   Sodium 131 (*) 135 - 145 (mEq/L)   Potassium 4.0  3.5 - 5.1 (mEq/L)   Chloride 96  96 - 112 (mEq/L)   CO2 23  19 - 32 (mEq/L)   Glucose, Bld 117 (*) 70 - 99 (mg/dL)   BUN 23  6 - 23 (mg/dL)   Creatinine, Ser 0.84  0.50 - 1.10 (mg/dL)   Calcium 8.6  8.4 - 10.5 (mg/dL)   Total Protein 6.7  6.0 - 8.3 (g/dL)   Albumin 3.7  3.5 - 5.2 (g/dL)   AST 22  0 - 37 (U/L)   ALT 21  0 - 35 (U/L)   Alkaline Phosphatase 59  39 - 117 (U/L)  Total Bilirubin 0.2 (*) 0.3 - 1.2 (mg/dL)   GFR calc non Af Amer 74 (*) >90 (mL/min)   GFR calc Af Amer 86 (*) >90 (mL/min)  LIPASE, BLOOD      Component Value Range   Lipase 28  11 - 59 (U/L)  TROPONIN I      Component Value Range   Troponin I <0.30  <0.30 (ng/mL)     1. Chest pain   2. Abdominal pain   3. Headache       MDM  The patient's evaluation for the head pain and chest pain and right lower quadrant abdominal pain on CAT scans without any specific findings. Patient never had any anterior chest pain was all between the shoulder blades CT ruled out the PE or any type of dissection. However since  the pain started around 9 or 9:30 this morning six-hour repeat troponin is appropriate that has been ordered and is pending. Patient returned over to the evening ED physician which will check that and is negative patient can be discharged home with prescription for pain medicine and followup with her primary care Dr. Reva Bores without any specific findings no leukocytosis no electrolyte abnormalities urinalysis is negative for urinary tract infection.        Mervin Kung, MD 03/30/12 [phone]

## 2012-03-30 NOTE — Discharge Instructions (Signed)
Return for new or worse chest pain or abdominal pain. Today's CAT scans of your head chest and abdomen without any significant findings. Recommend you followup with your primary care doctor in the next few days.

## 2012-03-30 NOTE — ED Notes (Addendum)
Pt debated about taking pain medication. rn offered something weaker like tylenol to pt, pt reported that tylenol did not work for her. Pt reports she has bad reactions to morphine. RN asked pt what pain medication pt would like to try and she said "she didn't know what she wanted". Pt and family finally decided to try the dilaudid. Before medication pt complained of head pain, said it was relieved when bed was laid back. After rn slowly administered pain medication pt reported that she "felt like she was kicked in the back of the head, and that her arms felt numb and weak, like lead." rn offered to turn light off and that this might possibly help pts head pain go away.

## 2012-03-30 NOTE — ED Notes (Signed)
Pt in from work by [employer]. Pt had sudden onset diaphoresis, pain between shoulder blades and RLQ pain. Denies chest pain, sob. ems bp 124/78, p 95, cbg 124. Pt reports one episode similarly 3 yrs ago, but not as bad. Recent stressors at home as well, Hx of depression, anxiety.

## 2012-03-30 NOTE — ED Notes (Signed)
md at bedside.  Pt alert and oriented x4. Respirations even and unlabored, bilateral symmetrical rise and fall of chest. Skin warm and dry. In no acute distress. Denies needs.

## 2012-03-30 NOTE — ED Notes (Signed)
Pt alert and oriented x4. Respirations even and unlabored, bilateral symmetrical rise and fall of chest. Skin warm and dry. In no acute distress. Denies needs.   

## 2012-03-30 NOTE — ED Notes (Addendum)
Pt is tearful upon assessment. Reports her lower right abdomen hurts. Reports her pain started this morning 5/10, pt states she threw up 2x last night. No n/v since then, states all of her symptoms started at various times. "did not feel well when she went to work". Pt unsure if she can walk to the bathroom, "unsure of how she feels right now". Family at bedside.  Also reports her head hurts and has generalized complaints.

## 2013-06-02 ENCOUNTER — Encounter (HOSPITAL_BASED_OUTPATIENT_CLINIC_OR_DEPARTMENT_OTHER): Payer: Self-pay | Admitting: *Deleted

## 2013-06-02 ENCOUNTER — Emergency Department (HOSPITAL_BASED_OUTPATIENT_CLINIC_OR_DEPARTMENT_OTHER): Payer: BC Managed Care – PPO

## 2013-06-02 ENCOUNTER — Observation Stay (HOSPITAL_BASED_OUTPATIENT_CLINIC_OR_DEPARTMENT_OTHER)
Admission: EM | Admit: 2013-06-02 | Discharge: 2013-06-04 | Disposition: A | Discharge status: Home / Self Care | Payer: BC Managed Care – PPO | Attending: Internal Medicine | Admitting: Internal Medicine

## 2013-06-02 DIAGNOSIS — E7800 Pure hypercholesterolemia, unspecified: Secondary | ICD-10-CM | POA: Diagnosis present

## 2013-06-02 DIAGNOSIS — K219 Gastro-esophageal reflux disease without esophagitis: Secondary | ICD-10-CM | POA: Insufficient documentation

## 2013-06-02 DIAGNOSIS — R079 Chest pain, unspecified: Secondary | ICD-10-CM | POA: Diagnosis present

## 2013-06-02 DIAGNOSIS — I10 Essential (primary) hypertension: Secondary | ICD-10-CM | POA: Diagnosis present

## 2013-06-02 DIAGNOSIS — R0789 Other chest pain: Principal | ICD-10-CM | POA: Insufficient documentation

## 2013-06-02 DIAGNOSIS — Z79899 Other long term (current) drug therapy: Secondary | ICD-10-CM | POA: Insufficient documentation

## 2013-06-02 DIAGNOSIS — I498 Other specified cardiac arrhythmias: Secondary | ICD-10-CM | POA: Insufficient documentation

## 2013-06-02 LAB — COMPREHENSIVE METABOLIC PANEL
CO2: 27 mEq/L (ref 19–32)
Calcium: 10.3 mg/dL (ref 8.4–10.5)
Creatinine, Ser: 0.8 mg/dL (ref 0.50–1.10)
GFR calc Af Amer: >90 mL/min (ref >90)
GFR calc non Af Amer: 78 mL/min — ABNORMAL LOW (ref >90)
Glucose, Bld: 111 mg/dL — ABNORMAL HIGH (ref 70–99)

## 2013-06-02 LAB — CBC WITH DIFFERENTIAL/PLATELET
Eosinophils Relative: 0 % (ref 0–5)
HCT: 36.8 % (ref 36.0–46.0)
Lymphocytes Relative: 21 % (ref 12–46)
Lymphs Abs: 1.6 10*3/uL (ref 0.7–4.0)
MCH: 29 pg (ref 26.0–34.0)
MCV: 86 fL (ref 78.0–100.0)
Monocytes Absolute: 0.7 10*3/uL (ref 0.1–1.0)
RBC: 4.28 MIL/uL (ref 3.87–5.11)
RDW: 12.4 % (ref 11.5–15.5)
WBC: 7.9 10*3/uL (ref 4.0–10.5)

## 2013-06-02 LAB — CBC
Hemoglobin: 11.8 g/dL — ABNORMAL LOW (ref 12.0–15.0)
MCH: 28.9 pg (ref 26.0–34.0)
MCHC: 33.9 g/dL (ref 30.0–36.0)
Platelets: 332 10*3/uL (ref 150–400)
RDW: 12.6 % (ref 11.5–15.5)

## 2013-06-02 LAB — D-DIMER, QUANTITATIVE: D-Dimer, Quant: <0.27 ug/mL-FEU (ref 0.00–0.48)

## 2013-06-02 LAB — TROPONIN I: Troponin I: <0.3 ng/mL (ref <0.30)

## 2013-06-02 LAB — CREATININE, SERUM
Creatinine, Ser: 0.77 mg/dL (ref 0.50–1.10)
GFR calc non Af Amer: 89 mL/min — ABNORMAL LOW (ref >90)

## 2013-06-02 MED ORDER — RALOXIFENE HCL 60 MG PO TABS
60.0000 mg | ORAL_TABLET | Freq: Every day | ORAL | Status: DC
  Administered 2013-06-02 – 2013-06-03 (×2): 60 mg via ORAL
  Filled 2013-06-02 (×3): qty 1

## 2013-06-02 MED ORDER — AMLODIPINE BESYLATE 10 MG PO TABS
10.0000 mg | ORAL_TABLET | Freq: Every day | ORAL | Status: DC
  Administered 2013-06-03 – 2013-06-04 (×2): 10 mg via ORAL
  Filled 2013-06-02 (×3): qty 1

## 2013-06-02 MED ORDER — VITAMIN D3 25 MCG (1000 UNIT) PO TABS
2000.0000 [IU] | ORAL_TABLET | Freq: Every day | ORAL | Status: DC
  Administered 2013-06-03 – 2013-06-04 (×2): 2000 [IU] via ORAL
  Filled 2013-06-02 (×2): qty 2

## 2013-06-02 MED ORDER — GI COCKTAIL ~~LOC~~
30.0000 mL | Freq: Four times a day (QID) | ORAL | Status: DC | PRN
  Administered 2013-06-02 – 2013-06-03 (×3): 30 mL via ORAL
  Filled 2013-06-02 (×3): qty 30

## 2013-06-02 MED ORDER — ASPIRIN 81 MG PO CHEW
243.0000 mg | CHEWABLE_TABLET | Freq: Once | ORAL | Status: AC
  Administered 2013-06-02: 243 mg via ORAL
  Filled 2013-06-02: qty 3

## 2013-06-02 MED ORDER — ASPIRIN 81 MG PO CHEW
81.0000 mg | CHEWABLE_TABLET | Freq: Every day | ORAL | Status: DC
  Administered 2013-06-03 – 2013-06-04 (×2): 81 mg via ORAL
  Filled 2013-06-02 (×2): qty 1

## 2013-06-02 MED ORDER — FAMOTIDINE 20 MG PO TABS
20.0000 mg | ORAL_TABLET | Freq: Two times a day (BID) | ORAL | Status: DC
  Administered 2013-06-02 – 2013-06-04 (×4): 20 mg via ORAL
  Filled 2013-06-02 (×5): qty 1

## 2013-06-02 MED ORDER — IRBESARTAN 75 MG PO TABS
75.0000 mg | ORAL_TABLET | Freq: Every day | ORAL | Status: DC
  Administered 2013-06-03 – 2013-06-04 (×2): 75 mg via ORAL
  Filled 2013-06-02 (×3): qty 1

## 2013-06-02 MED ORDER — EZETIMIBE 10 MG PO TABS
10.0000 mg | ORAL_TABLET | Freq: Every day | ORAL | Status: DC
  Administered 2013-06-03: 10 mg via ORAL
  Filled 2013-06-02 (×2): qty 1

## 2013-06-02 MED ORDER — ONDANSETRON HCL 4 MG/2ML IJ SOLN: 4.0000 mg | Freq: Four times a day (QID) | INTRAMUSCULAR | Status: DC | PRN

## 2013-06-02 MED ORDER — ACETAMINOPHEN 325 MG PO TABS: 650.0000 mg | ORAL_TABLET | ORAL | Status: DC | PRN

## 2013-06-02 MED ORDER — ENOXAPARIN SODIUM 40 MG/0.4ML ~~LOC~~ SOLN
40.0000 mg | SUBCUTANEOUS | Status: DC
  Administered 2013-06-03: 40 mg via SUBCUTANEOUS
  Filled 2013-06-02 (×3): qty 0.4

## 2013-06-02 MED ORDER — TRAZODONE HCL 50 MG PO TABS
50.0000 mg | ORAL_TABLET | Freq: Every day | ORAL | Status: DC
  Administered 2013-06-02 – 2013-06-03 (×2): 50 mg via ORAL
  Filled 2013-06-02 (×3): qty 1

## 2013-06-02 MED ORDER — CLORAZEPATE DIPOTASSIUM 3.75 MG PO TABS: 7.5000 mg | ORAL_TABLET | Freq: Two times a day (BID) | ORAL | Status: DC | PRN

## 2013-06-02 MED ORDER — PANTOPRAZOLE SODIUM 40 MG PO TBEC
40.0000 mg | DELAYED_RELEASE_TABLET | Freq: Every day | ORAL | Status: DC
  Administered 2013-06-02 – 2013-06-04 (×3): 40 mg via ORAL
  Filled 2013-06-02 (×3): qty 1

## 2013-06-02 NOTE — ED Notes (Signed)
Patient states that her BP is high, her chest is tight under her left arm

## 2013-06-02 NOTE — Progress Notes (Signed)
Transfer from [place]  ED. Patient is 62 year old female history of hypertension, hyperlipidemia, significant family history of coronary artery disease who presented to the ED with left-sided chest and radiating to the left upper extremity. 2 days prior to admission patient had some right-sided chest pain in the shoulder blade which radiated to the right which resolved on its own. On day of admission patient presented with left-sided chest pain described as a tightness radiating to the left upper extremity. Improved in the ED. Patient to be admitted for chest pain rule out. Due to patient's risk factors and  presentation may consider cardiology consultation. Patient will be admitted to cardiac telemetry.

## 2013-06-02 NOTE — ED Provider Notes (Addendum)
History    CSN: [id number] Arrival date & time 06/02/13  1209  First MD Initiated Contact with Patient 06/02/13 1224     Chief Complaint  Patient presents with  . Chest Pain   (Consider location/radiation/quality/duration/timing/severity/associated sxs/prior Treatment) HPI Comments: Patient presents to the ER for evaluation of chest pain. Patient has been having intermittent episodes of chest pain for 2 days. 2 days ago she had right-sided pain, near the shoulder blade which radiated down her right arm. She's not sure how long it lasted but it did resolve. She did not have shortness of breath, nausea or diaphoresis associated with it. Today she had left-sided anterior upper chest tightness radiating to left arm. Again, no shortness of breath, nausea or diaphoresis. Blood pressure was elevated. At arrival to the ER, symptoms have resolved.  Patient is a 62 y.o. female presenting with chest pain.  Chest Pain  Past Medical History  Diagnosis Date  . Hypertension   . Depression   . Hypercholesteremia    History reviewed. No pertinent past surgical history. No family history on file. History  Substance Use Topics  . Smoking status: Not on file  . Smokeless tobacco: Not on file  . Alcohol Use: No   OB History   Grav Para Term Preterm Abortions TAB SAB Ect Mult Living                 Review of Systems  Cardiovascular: Positive for chest pain.  All other systems reviewed and are negative.    Allergies  Azithromycin; Codeine; and Lisinopril  Home Medications   Current Outpatient Rx  Name  Route  Sig  Dispense  Refill  . amLODipine (NORVASC) 10 MG tablet   Oral   Take 10 mg by mouth daily.         Marland Kitchen aspirin 81 MG chewable tablet   Oral   Chew 81 mg by mouth daily.         Marland Kitchen buPROPion (WELLBUTRIN XL) 300 MG 24 hr tablet   Oral   Take 300 mg by mouth daily.         . cholecalciferol (VITAMIN D) 1000 UNITS tablet   Oral   Take 2,000 Units by mouth daily.          . clorazepate (TRANXENE) 7.5 MG tablet   Oral   Take 7.5 mg by mouth 2 (two) times daily as needed.         . Cranberry 600 MG TABS   Oral   Take 2 tablets by mouth daily.         . fluticasone (FLONASE) 50 MCG/ACT nasal spray   Nasal   Place 1 spray into the nose daily.         Marland Kitchen olmesartan (BENICAR) 40 MG tablet   Oral   Take 40 mg by mouth daily.         . RABEprazole (ACIPHEX) 20 MG tablet   Oral   Take 20 mg by mouth daily.         . raloxifene (EVISTA) 60 MG tablet   Oral   Take 60 mg by mouth daily.         . Red Yeast Rice Extract 600 MG CAPS   Oral   Take 1,200 capsules by mouth daily.         . traZODone (DESYREL) 100 MG tablet   Oral   Take 100 mg by mouth.         Marland Kitchen  vitamin C (ASCORBIC ACID) 500 MG tablet   Oral   Take 500 mg by mouth daily.         . vitamin E 400 UNIT capsule   Oral   Take 400 Units by mouth daily.          BP 129/94  Pulse 102  Temp(Src) 97.9 F (36.6 C) (Oral)  Resp 18  Ht 5' 3"  (1.6 m)  Wt 152 lb (68.947 kg)  BMI 26.93 kg/m2  SpO2 100% Physical Exam  Constitutional: She is oriented to person, place, and time. She appears well-developed and well-nourished. No distress.  HENT:  Head: Normocephalic and atraumatic.  Right Ear: Hearing normal.  Left Ear: Hearing normal.  Nose: Nose normal.  Mouth/Throat: Oropharynx is clear and moist and mucous membranes are normal.  Eyes: Conjunctivae and EOM are normal. Pupils are equal, round, and reactive to light.  Neck: Normal range of motion. Neck supple.  Cardiovascular: Regular rhythm, S1 normal and S2 normal.  Exam reveals no gallop and no friction rub.   No murmur heard. Pulmonary/Chest: Effort normal and breath sounds normal. No respiratory distress. She exhibits no tenderness.  Abdominal: Soft. Normal appearance and bowel sounds are normal. There is no hepatosplenomegaly. There is no tenderness. There is no rebound, no guarding, no tenderness at  McBurney's point and negative Murphy's sign. No hernia.  Musculoskeletal: Normal range of motion.  Neurological: She is alert and oriented to person, place, and time. She has normal strength. No cranial nerve deficit or sensory deficit. Coordination normal. GCS eye subscore is 4. GCS verbal subscore is 5. GCS motor subscore is 6.  Skin: Skin is warm, dry and intact. No rash noted. No cyanosis.  Psychiatric: She has a normal mood and affect. Her speech is normal and behavior is normal. Thought content normal.    ED Course  Procedures (including critical care time)  EKG:  Date: 06/02/2013  Rate: 108  Rhythm: sinus tachycardia  QRS Axis: normal  Intervals: normal  ST/T Wave abnormalities: normal  Conduction Disutrbances:none  Narrative Interpretation:   Old EKG Reviewed: unchanged   Labs Reviewed  COMPREHENSIVE METABOLIC PANEL - Abnormal; Notable for the following:    Glucose, Bld 111 (*)    ALT 36 (*)    Total Bilirubin 0.1 (*)    GFR calc non Af Amer 78 (*)    All other components within normal limits  CBC WITH DIFFERENTIAL  TROPONIN I  D-DIMER, QUANTITATIVE   Dg Chest 2 View  06/02/2013   *RADIOLOGY REPORT*  Clinical Data: Chest pain  CHEST - 2 VIEW  Comparison: 03/31/2011  Findings: Heart size is normal.  No pleural effusion or edema identified.  No airspace consolidation identified.  There is mild spondylosis within the thoracic spine.  IMPRESSION:  1.  No acute cardiopulmonary abnormalities. 2.  Mild thoracic spondylosis noted.   Original Report Authenticated By: Kerby Moors, M.D.   Diagnosis: Chest pain  MDM  Presents to the ER with complaints of 2 episodes of chest pain in the last 3 days. The first episode was 3 days ago it was right-sided, somewhat atypical. Today, however, she had left anterior chest tightness radiating down the left arm. This is more typical, although it occurred at rest it was not exertional. Patient does not have any known coronary artery disease,  but does have multiple cardiac risk factors. She has a history of hypertension and high cholesterol with a significant family history. Patient has a brother who has had  bypass surgery.  Patient's EKG showed tachycardia but no acute ischemic changes and no acute infarct. She seems slightly anxious, this likely explains the tachycardia. D-dimer was negative. Troponin was negative. Because of the patient's cardiac risk factors, I recommended observation in the hospital overnight.  Discussed with Doctor Grandville Silos, [facility] hospitalist. The patient is accepted for transfer to [facility] for observation.  Orpah Greek, MD 06/02/13 [place], MD 06/02/13 1501

## 2013-06-02 NOTE — ED Notes (Signed)
Floor will return call for report unit [facility]

## 2013-06-02 NOTE — H&P (Addendum)
[facility] History and Physical  Erika Luna  [id number]:[id number]  DOB: [birth year]  DOA: 06/02/2013  Referring physician: Dr Betsey Holiday PCP: Chesley Noon, MD   Chief Complaint: chest tightness  HPI: Erika Luna is a 62 y.o. female with Past medical history of hypertension and dyslipidemia presented today with c/o chest tightness that started this AM. She had right sided chest pain yesterday that was near her scapula and lasted for few mins and resolved on its own. Since then she has been feeling tired and exhausted on exertion. She had left side chest tightness this AM that lasted again for few mins and resolved on its own. Along with that she has some dizziness and headache as well. She denies any respiratory symptoms She has severe GERD and has been on twice a day PPI and recently has also been placed on h2 blockers due to sudden flare of her symptoms.  Review of Systems: as mentioned in the history of present illness.  A Comprehensive review of the other systems is negative.  Past Medical History  Diagnosis Date  . Hypertension   . Depression   . Hypercholesteremia    History reviewed. No pertinent past surgical history. Social History:  reports that she does not drink alcohol. Her tobacco and drug histories are not on file. Patient is coming from home. Patient can not participate in ADLs.  Allergies  Allergen Reactions  . Azithromycin Nausea And Vomiting  . Codeine Nausea And Vomiting  . Lisinopril Rash    Family History  Problem Relation Age of Onset  . Hyperlipidemia Brother   . Coronary artery disease Brother 32    CABG 2013  . Peripheral vascular disease Brother   . Hypertension Mother   . Hypertension Father   . Hypertension Brother     Prior to Admission medications   Medication Sig Start Date End Date Taking? Authorizing Provider  ezetimibe (ZETIA) 10 MG tablet Take 10 mg by mouth daily.   Yes Historical Provider, MD  amLODipine  (NORVASC) 10 MG tablet Take 10 mg by mouth daily.    Historical Provider, MD  aspirin 81 MG chewable tablet Chew 81 mg by mouth daily.    Historical Provider, MD  cholecalciferol (VITAMIN D) 1000 UNITS tablet Take 2,000 Units by mouth daily.    Historical Provider, MD  clorazepate (TRANXENE) 7.5 MG tablet Take 7.5 mg by mouth 2 (two) times daily as needed.    Historical Provider, MD  Cranberry 600 MG TABS Take 2 tablets by mouth daily.    Historical Provider, MD  olmesartan (BENICAR) 40 MG tablet Take 40 mg by mouth daily.    Historical Provider, MD  RABEprazole (ACIPHEX) 20 MG tablet Take 20 mg by mouth 2 (two) times daily.     Historical Provider, MD  raloxifene (EVISTA) 60 MG tablet Take 60 mg by mouth daily.    Historical Provider, MD  Red Yeast Rice Extract 600 MG CAPS Take 1,200 capsules by mouth daily.    Historical Provider, MD  traZODone (DESYREL) 100 MG tablet Take 50 mg by mouth at bedtime.     Historical Provider, MD  vitamin C (ASCORBIC ACID) 500 MG tablet Take 500 mg by mouth daily.    Historical Provider, MD  vitamin E 400 UNIT capsule Take 400 Units by mouth daily.    Historical Provider, MD    Physical Exam: Filed Vitals:   06/02/13 1219 06/02/13 1631 06/02/13 1707  BP: 129/94 128/81 132/85  Pulse: 102 84 94  Temp: 97.9 F (36.6 C)    TempSrc: Oral    Resp: 18 18 20   Height: 5' 3"  (1.6 m)    Weight: 68.947 kg (152 lb)    SpO2: 100% 100% 100%    General: Alert, Awake and Oriented to Time, Place and Person. Appear in no distress Eyes: PERRL ENT: Oral Mucosa clear moist. Neck: no JVD, no Carotid Bruits, no Stiffness Cardiovascular: S1 and S2 Present, Murmur no, Peripheral Pulses Present Respiratory: Clear to Auscultation, Bilateral Air entry equal and Decreased,  Abdomen: Bowel Sound Present, Soft and Non tender no Organomegaly Skin: no decubitus Ulcer Extremities: Pedal edema no, calf tenderness no. Neurologic: Mental status, Motor strength, Sensation, reflexes,  Proprioception Grossly Unremarkable.  Labs on Admission:  Basic Metabolic Panel:  Recent Labs Lab 06/02/13 1320  NA 135  K 3.9  CL 97  CO2 27  GLUCOSE 111*  BUN 18  CREATININE 0.80  CALCIUM 10.3   Liver Function Tests:  Recent Labs Lab 06/02/13 1320  AST 25  ALT 36*  ALKPHOS 89  BILITOT 0.1*  PROT 7.7  ALBUMIN 3.8   No results found for this basename: LIPASE, AMYLASE,  in the last 168 hours No results found for this basename: AMMONIA,  in the last 168 hours CBC:  Recent Labs Lab 06/02/13 1320  WBC 7.9  NEUTROABS 5.5  HGB 12.4  HCT 36.8  MCV 86.0  PLT 392   Cardiac Enzymes:  Recent Labs Lab 06/02/13 1320  TROPONINI <0.30    BNP (last 3 results) No results found for this basename: PROBNP,  in the last 8760 hours CBG: No results found for this basename: GLUCAP,  in the last 168 hours  Radiological Exams on Admission: Dg Chest 2 View  06/02/2013   *RADIOLOGY REPORT*  Clinical Data: Chest pain  CHEST - 2 VIEW  Comparison: 03/31/2011  Findings: Heart size is normal.  No pleural effusion or edema identified.  No airspace consolidation identified.  There is mild spondylosis within the thoracic spine.  IMPRESSION:  1.  No acute cardiopulmonary abnormalities. 2.  Mild thoracic spondylosis noted.   Original Report Authenticated By: Kerby Moors, M.D.    EKG: Independently reviewed. And no ST-T wave changed suggestive of acute ischemia seen  Assessment/Plan Principal Problem:   Chest pain Active Problems:   Hypertension   Hypercholesteremia   1. Chest pan The pt will be admitted under observation for serial troponin, monitoring on tele. We will repeat EKG tomorrow. At present the pt does not have ACS and it is likely that her symptoms are due to GERD. But she does have significant family history as well as hypertension. D dimer is negative and she does not have significant risk factor for DVT other then being on raloxifene. Therefor further work up not  necessary for DVT at present.  2.Hypertension  Continuing her home medication for blood pressure 3.GERD Continuing PPI and H2 blocker at present   DVT Prophylaxis: Lovenox 40 mg Q24 hrs Nutrition: cardiac diet  Code Status: full   Author: Berle Mull, MD [facility] Pager: [phone] 06/02/2013 8:51 PM    If 7PM-7AM, please contact night-coverage [email or web address] Password [email or web address]

## 2013-06-03 ENCOUNTER — Encounter (HOSPITAL_COMMUNITY): Payer: Self-pay | Admitting: *Deleted

## 2013-06-03 DIAGNOSIS — I10 Essential (primary) hypertension: Secondary | ICD-10-CM

## 2013-06-03 DIAGNOSIS — R079 Chest pain, unspecified: Secondary | ICD-10-CM

## 2013-06-03 DIAGNOSIS — E7800 Pure hypercholesterolemia, unspecified: Secondary | ICD-10-CM

## 2013-06-03 LAB — LIPID PANEL
HDL: 73 mg/dL (ref >39)
LDL Cholesterol: 70 mg/dL (ref 0–99)
Total CHOL/HDL Ratio: 2.3 RATIO
VLDL: 23 mg/dL (ref 0–40)

## 2013-06-03 LAB — HEMOGLOBIN A1C
Hgb A1c MFr Bld: 5.9 % — ABNORMAL HIGH (ref <5.7)
Mean Plasma Glucose: 123 mg/dL — ABNORMAL HIGH (ref <117)

## 2013-06-03 LAB — TROPONIN I: Troponin I: <0.3 ng/mL (ref <0.30)

## 2013-06-03 NOTE — Progress Notes (Signed)
[facility] PROGRESS NOTE  Erika Luna [id number] DOB: [birth year] DOA: 06/02/2013 PCP: Chesley Noon, MD  HPI: Erika Luna is a 62 y.o. female with Past medical history of hypertension and dyslipidemia presented today with c/o chest tightness that started this AM. She had right sided chest pain yesterday that was near her scapula and lasted for few mins and resolved on its own. Since then she has been feeling tired and exhausted on exertion. She had left side chest tightness this AM that lasted again for few mins and resolved on its own. Along with that she has some dizziness and headache as well. She denies any respiratory symptoms She has severe GERD and has been on twice a day PPI and recently has also been placed on h2 blockers due to sudden flare of her symptoms.  Assessment/Plan: Chest pain - cardiac enzymes negative x 3 - arrange for inpatient stress test, discussed on with on call cardiologist Dr. Aundra Dubin At present the pt does not have ACS and it is likely that her symptoms are due to GERD.  But she does have significant family history as well as hypertension.   D dimer is negative and she does not have significant risk factor for DVT other then being on raloxifene. Therefor further work up not necessary for DVT at present.  Hypertension  Continuing her home medication for blood pressure  GERD Continuing PPI and H2 blocker at present  Code Status: Full Family Communication: none  Disposition Plan: stress test tomorrow  Consultants:  none  Procedures:  none  Antibiotics:  Anti-infectives   None     Antibiotics Given (last 72 hours)   None     HPI/Subjective: - no chest pain this morning, no complaints  Objective: Filed Vitals:   06/02/13 1631 06/02/13 1707 06/02/13 2100 06/03/13 0500  BP: 128/81 132/85 137/95 138/93  Pulse: 84 94 73 94  Temp:   98.6 F (37 C) 97.9 F (36.6 C)  TempSrc:      Resp: 18 20 18 18   Height:       Weight:      SpO2: 100% 100% 100% 100%    Intake/Output Summary (Last 24 hours) at 06/03/13 0731 Last data filed at 06/02/13 2100  Gross per 24 hour  Intake    120 ml  Output      0 ml  Net    120 ml   Filed Weights   06/02/13 1219  Weight: 68.947 kg (152 lb)    Exam:   General:  NAD  Cardiovascular: regular rate and rhythm, without MRG  Respiratory: good air movement, clear to auscultation throughout, no wheezing, ronchi or rales  Abdomen: soft, not tender to palpation, positive bowel sounds  MSK: no peripheral edema  Neuro: CN 2-12 grossly intact, MS 5/5 in all 4  Data Reviewed: Basic Metabolic Panel:  Recent Labs Lab 06/02/13 1320 06/02/13 2230  NA 135  --   K 3.9  --   CL 97  --   CO2 27  --   GLUCOSE 111*  --   BUN 18  --   CREATININE 0.80 0.77  CALCIUM 10.3  --    Liver Function Tests:  Recent Labs Lab 06/02/13 1320  AST 25  ALT 36*  ALKPHOS 89  BILITOT 0.1*  PROT 7.7  ALBUMIN 3.8   CBC:  Recent Labs Lab 06/02/13 1320 06/02/13 2230  WBC 7.9 8.1  NEUTROABS 5.5  --   HGB 12.4 11.8*  HCT  36.8 34.8*  MCV 86.0 85.1  PLT 392 332   Cardiac Enzymes:  Recent Labs Lab 06/02/13 1320 06/02/13 2230 06/03/13 0541  TROPONINI <0.30 <0.30 <0.30   Studies: Dg Chest 2 View  06/02/2013   *RADIOLOGY REPORT*  Clinical Data: Chest pain  CHEST - 2 VIEW  Comparison: 03/31/2011  Findings: Heart size is normal.  No pleural effusion or edema identified.  No airspace consolidation identified.  There is mild spondylosis within the thoracic spine.  IMPRESSION:  1.  No acute cardiopulmonary abnormalities. 2.  Mild thoracic spondylosis noted.   Original Report Authenticated By: Kerby Moors, M.D.    Scheduled Meds: . amLODipine  10 mg Oral Daily  . aspirin  81 mg Oral Daily  . cholecalciferol  2,000 Units Oral Daily  . enoxaparin (LOVENOX) injection  40 mg Subcutaneous Q24H  . ezetimibe  10 mg Oral Daily  . famotidine  20 mg Oral BID  . irbesartan   75 mg Oral Daily  . pantoprazole  40 mg Oral Daily  . raloxifene  60 mg Oral Daily  . traZODone  50 mg Oral QHS   Continuous Infusions:   Principal Problem:   Chest pain Active Problems:   Hypertension   Hypercholesteremia  Time spent: [place], MD [facility] Pager [phone]. If 7 PM - 7 AM, please contact night-coverage at [email or web address], password [facility] 06/03/2013, 7:31 AM  LOS: 1 day

## 2013-06-04 ENCOUNTER — Observation Stay (HOSPITAL_COMMUNITY): Payer: BC Managed Care – PPO

## 2013-06-04 DIAGNOSIS — R079 Chest pain, unspecified: Secondary | ICD-10-CM

## 2013-06-04 MED ORDER — TECHNETIUM TC 99M SESTAMIBI GENERIC - CARDIOLITE
10.0000 | Freq: Once | INTRAVENOUS | Status: AC | PRN
  Administered 2013-06-04: 10 via INTRAVENOUS

## 2013-06-04 MED ORDER — TECHNETIUM TC 99M SESTAMIBI GENERIC - CARDIOLITE
30.0000 | Freq: Once | INTRAVENOUS | Status: AC | PRN
  Administered 2013-06-04: 30 via INTRAVENOUS

## 2013-06-04 MED ORDER — REGADENOSON 0.4 MG/5ML IV SOLN
0.4000 mg | Freq: Once | INTRAVENOUS | Status: AC
  Administered 2013-06-04: 0.4 mg via INTRAVENOUS
  Filled 2013-06-04: qty 5

## 2013-06-04 NOTE — Progress Notes (Addendum)
Called by primary MD re: SVT seen during stress test. Pt has a history of palpitations. He requests evaluation, outpatient is OK. Have set pt up for evaluation by Dr Aundra Dubin 7/22 and information is on D/C sheet.   Was able to catch SVT during test, see stress test ECGs.

## 2013-06-04 NOTE — Progress Notes (Signed)
Pt for nuclear stress test today. Enzymes negative for MI. ECG not acute. Pt got light-headed when she stood up (NPO all morning) and requests pharmacologic stress. Will do Lexiscan.

## 2013-06-04 NOTE — Discharge Summary (Signed)
Physician Discharge Summary  Erika Luna QQV:[id number] DOB: Feb 17, 1951 DOA: 06/02/2013  PCP: Chesley Noon, MD  Admit date: 06/02/2013 Discharge date: 06/04/2013  Time spent: 35 minutes  Recommendations for Outpatient Follow-up:  1. Follow up with Cardiology as scheduled below 2. Follow up with PCP later this week for post hospital stay   Discharge Diagnoses:  Principal Problem:   Chest pain Active Problems:   Hypertension   Hypercholesteremia  Discharge Condition: stable  Diet recommendation: heart healthy  Filed Weights   06/02/13 1219  Weight: 68.947 kg (152 lb)   History of present illness:  Erika Luna is a 62 y.o. female with Past medical history of hypertension and dyslipidemia presented today with c/o chest tightness that started this AM. She had right sided chest pain yesterday that was near her scapula and lasted for few mins and resolved on its own. Since then she has been feeling tired and exhausted on exertion. She had left side chest tightness this AM that lasted again for few mins and resolved on its own. Along with that she has some dizziness and headache as well. She denies any respiratory symptoms She has severe GERD and has been on twice a day PPI and recently has also been placed on h2 blockers due to sudden flare of her symptoms.  Hospital Course:  Chest pain - cardiac enzymes negative x 3. Given risk factors and presentation patient underwent inpatient stress test which revealed a normal study without evidence of ischemia (full read below). During her stress test, patient had a short self resolved SVT which was symptomatic, similar to her previous episodes of palpitations that she has had for years. I subsequently asked cardiology to follow her up for her SVT and she has an appointment tomorrow 7/22 with Dr. Aundra Dubin. During her hospital stay, Erika Luna experienced no episodes of chest pain/tightness.  Hypertension - Continuing her home  medication for blood pressure  GERD- Continuing PPI and H2 blocker at present  Procedures:  Stress test   Consultations:  none  Discharge Exam: Filed Vitals:   06/04/13 1127 06/04/13 1129 06/04/13 1131 06/04/13 1425  BP: 118/72 117/74 104/71 115/75  Pulse:    103  Temp:    97.2 F (36.2 C)  TempSrc:      Resp:    16  Height:      Weight:      SpO2:    100%   General: NAD Cardiovascular: RRR Respiratory: CTA biL  Discharge Instructions   Future Appointments Provider Department Dept Phone   06/05/2013 1:45 PM Larey Dresser, MD [facility]) [phone]       Medication List    STOP taking these medications       buPROPion 300 MG 24 hr tablet  Commonly known as:  WELLBUTRIN XL     fluticasone 50 MCG/ACT nasal spray  Commonly known as:  FLONASE      TAKE these medications       amLODipine 10 MG tablet  Commonly known as:  NORVASC  Take 10 mg by mouth daily.     aspirin 81 MG chewable tablet  Chew 81 mg by mouth daily.     cholecalciferol 1000 UNITS tablet  Commonly known as:  VITAMIN D  Take 1,000 Units by mouth daily.     clorazepate 7.5 MG tablet  Commonly known as:  TRANXENE  Take 7.5 mg by mouth daily.     Cranberry 600 MG Tabs  Take 600  mg by mouth daily.     ezetimibe 10 MG tablet  Commonly known as:  ZETIA  Take 10 mg by mouth daily.     ketotifen 0.025 % ophthalmic solution  Commonly known as:  ZADITOR  Place 1 drop into both eyes 2 (two) times daily.     olmesartan 40 MG tablet  Commonly known as:  BENICAR  Take 40 mg by mouth daily.     POTASSIUM PO  Take 1 tablet by mouth daily.     RABEprazole 20 MG tablet  Commonly known as:  ACIPHEX  Take 20 mg by mouth 2 (two) times daily.     raloxifene 60 MG tablet  Commonly known as:  EVISTA  Take 60 mg by mouth daily.     SOY FORMULA PO  Take 1 tablet by mouth daily.     traZODone 50 MG tablet  Commonly known as:  DESYREL  Take 50 mg by mouth at  bedtime.     vitamin C 500 MG tablet  Commonly known as:  ASCORBIC ACID  Take 500 mg by mouth daily.     vitamin E 400 UNIT capsule  Take 400 Units by mouth daily.           Follow-up Information   Follow up with Loralie Champagne, MD On 06/05/2013. (Please come at 1:30 pm for a 1:45 pm appointment)    Contact information:   [address] [place] [address] [phone]       Follow up with Chesley Noon, MD. Schedule an appointment as soon as possible for a visit in 3 days.   Contact information:   [place] [address] [phone]       The results of significant diagnostics from this hospitalization (including imaging, microbiology, ancillary and laboratory) are listed below for reference.    Significant Diagnostic Studies: Dg Chest 2 View  06/02/2013   *RADIOLOGY REPORT*  Clinical Data: Chest pain  CHEST - 2 VIEW  Comparison: 03/31/2011  Findings: Heart size is normal.  No pleural effusion or edema identified.  No airspace consolidation identified.  There is mild spondylosis within the thoracic spine.  IMPRESSION:  1.  No acute cardiopulmonary abnormalities. 2.  Mild thoracic spondylosis noted.   Original Report Authenticated By: Kerby Moors, M.D.   Nm Myocar Multi W/spect W/wall Motion / Ef  06/04/2013   *RADIOLOGY REPORT*  Clinical Data:  Chest pain, hyperlipidemia and hypertension.  MYOCARDIAL IMAGING WITH SPECT (REST AND PHARMACOLOGIC-STRESS) GATED LEFT VENTRICULAR WALL MOTION STUDY LEFT VENTRICULAR EJECTION FRACTION  Technique:  Standard myocardial SPECT imaging was performed after resting intravenous injection of 10 mCi Tc-62msestamibi. Subsequently, intravenous infusion of Lexiscan was performed under the supervision of the Cardiology staff.  At peak effect of the drug, 30 mCi Tc-960mestamibi was injected intravenously and standard myocardial SPECT  imaging was performed.  Quantitative gated imaging was  also performed to evaluate left ventricular wall motion, and estimate left ventricular ejection fraction.  Comparison:  None.  Findings: Utilizing gated data, the end-diastolic volume is estimated to be 39 ml and the end-systolic volume 9 ml.  Calculated ejection fraction is 76%.  Gated wall motion analysis is within normal limits.  SPECT imaging shows no evidence of inducible ischemia.  No fixed perfusion defects are identified.  IMPRESSION: Normal study demonstrating no evidence of inducible myocardial ischemia.  Left ventricular function is normal with quantitative ejection fraction calculation of 76%.  Original Report Authenticated By: Aletta Edouard, M.D.   Labs: Basic Metabolic Panel:  Recent Labs Lab 06/02/13 1320 06/02/13 2230  NA 135  --   K 3.9  --   CL 97  --   CO2 27  --   GLUCOSE 111*  --   BUN 18  --   CREATININE 0.80 0.77  CALCIUM 10.3  --    Liver Function Tests:  Recent Labs Lab 06/02/13 1320  AST 25  ALT 36*  ALKPHOS 89  BILITOT 0.1*  PROT 7.7  ALBUMIN 3.8   CBC:  Recent Labs Lab 06/02/13 1320 06/02/13 2230  WBC 7.9 8.1  NEUTROABS 5.5  --   HGB 12.4 11.8*  HCT 36.8 34.8*  MCV 86.0 85.1  PLT 392 332   Cardiac Enzymes:  Recent Labs Lab 06/02/13 1320 06/02/13 2230 06/03/13 0541 06/03/13 1202  TROPONINI <0.30 <0.30 <0.30 <0.30    Signed:  Marzetta Board  [facility] 06/04/2013, 5:01 PM

## 2013-06-05 ENCOUNTER — Encounter: Payer: Self-pay | Admitting: Cardiology

## 2013-06-05 ENCOUNTER — Ambulatory Visit (INDEPENDENT_AMBULATORY_CARE_PROVIDER_SITE_OTHER): Payer: BC Managed Care – PPO | Admitting: Cardiology

## 2013-06-05 VITALS — BP 132/82 | HR 107 | Ht 63.0 in | Wt 153.0 lb

## 2013-06-05 DIAGNOSIS — I498 Other specified cardiac arrhythmias: Secondary | ICD-10-CM

## 2013-06-05 DIAGNOSIS — I471 Supraventricular tachycardia: Secondary | ICD-10-CM

## 2013-06-05 DIAGNOSIS — R079 Chest pain, unspecified: Secondary | ICD-10-CM

## 2013-06-05 MED ORDER — METOPROLOL SUCCINATE ER 25 MG PO TB24: ORAL_TABLET | ORAL | Status: DC

## 2013-06-05 NOTE — Patient Instructions (Addendum)
Start Toprol XL (metoprolol succinate) 12.36m daily. This will be 1/2 of a 269mtablet daily.  Your physician recommends that you schedule a follow-up appointment in: 3 months with Dr McAundra Dubin

## 2013-06-06 DIAGNOSIS — I471 Supraventricular tachycardia, unspecified: Secondary | ICD-10-CM | POA: Insufficient documentation

## 2013-06-06 NOTE — Progress Notes (Signed)
Patient ID: Erika Luna, female   DOB: 1951-07-13, 62 y.o.   MRN: [id number] PCP: Dr. Melford Aase  62 yo with history of significant GERD, HTN, and hyperlipidemia presented to [facility] with chest pain.  The pain was a tightness that was associated with the sensation of her heart racing.  She was ruled out for MI and had a Lexiscan Myoview that showed no ischemia or infarction.  However, during [place] infusion, she had a run of rapid SVT.  This resolved spontaneously.  This felt like his symptoms prior to admission.  She has had occasional palpitations for year.  She has had no more episodes of SVT since discharge.  No further chest pain.  No significant exertional dyspnea.  Patient has been cutting back on caffeine intake (coffee) and is avoiding over-the-counter cold meds.    ECG: NSR, normal  Labs (7/14): TSH normal, LDL 70, HDL 53  PMH: 1. HTN 2. Hyperlipidemia 3. GERD 4. Depression 5. Chest pain: Atypical.  Lexiscan Myoview (7/14) with EF 76%, no ischemia or infarction.  6. SVT  SH: Nonsmoker, lives in [place], hairdresser at [employer]   FH: Brother with CABG at 39.  HTN.  ROS:  All systems reviewed and negative except as per HPI.   Current Outpatient Prescriptions  Medication Sig Dispense Refill  . amLODipine (NORVASC) 10 MG tablet Take 10 mg by mouth daily.      Marland Kitchen aspirin 81 MG chewable tablet Chew 81 mg by mouth daily.      . Calcium Carb-Vit D-Soy Isoflav (SOY FORMULA PO) Take 1 tablet by mouth daily.      . cholecalciferol (VITAMIN D) 1000 UNITS tablet Take 1,000 Units by mouth daily.      . clorazepate (TRANXENE) 7.5 MG tablet Take 7.5 mg by mouth daily.      . Cranberry 600 MG TABS Take 600 mg by mouth daily.      Marland Kitchen ezetimibe (ZETIA) 10 MG tablet Take 10 mg by mouth daily.      . fish oil-omega-3 fatty acids 1000 MG capsule Take 1 g by mouth daily.      Marland Kitchen ketotifen (ZADITOR) 0.025 % ophthalmic solution Place 1 drop into both eyes 2 (two) times daily.      Marland Kitchen  olmesartan (BENICAR) 40 MG tablet Take 40 mg by mouth daily.      Marland Kitchen POTASSIUM PO Take 1 tablet by mouth daily.      . RABEprazole (ACIPHEX) 20 MG tablet Take 20 mg by mouth 2 (two) times daily.       . raloxifene (EVISTA) 60 MG tablet Take 60 mg by mouth daily.      . traZODone (DESYREL) 50 MG tablet Take 50 mg by mouth at bedtime.      . vitamin C (ASCORBIC ACID) 500 MG tablet Take 500 mg by mouth daily.      . vitamin E 400 UNIT capsule Take 400 Units by mouth daily.      . metoprolol succinate (TOPROL XL) 25 MG 24 hr tablet 1/2 tablet (total 12.2m) daily  15 tablet  6   No current facility-administered medications for this visit.    BP 132/82  Pulse 107  Ht 5' 3"  (1.6 m)  Wt 69.4 kg (153 lb)  BMI 27.11 kg/m2  SpO2 98% General: NAD Neck: No JVD, no thyromegaly or thyroid nodule.  Lungs: Clear to auscultation bilaterally with normal respiratory effort. CV: Nondisplaced PMI.  Heart regular S1/S2, no S3/S4, no murmur.  No peripheral edema.  No carotid bruit.  Normal pedal pulses.  Abdomen: Soft, nontender, no hepatosplenomegaly, no distention.  Skin: Intact without lesions or rashes.  Neurologic: Alert and oriented x 3.  Psych: Normal affect. Extremities: No clubbing or cyanosis.  HEENT: Normal.   Assessment/Plan: 1. SVT: No further symptomatic recurrence.  I am going to have her add Toprol XL 25 mg daily to try to raise the threshold for SVT.  She has cut back on caffeine and will avoid OTC cold meds.  2. Chest pain: No further chest pain.  Lexiscan myoview showed no ischemia or infarction.  Given brother with history of MI and her history of HTN and hyperlipidemia, think it is reasonable for her to continue on ASA 81 mg daily.   Loralie Champagne 06/06/2013

## 2013-09-10 ENCOUNTER — Ambulatory Visit: Payer: BC Managed Care – PPO | Admitting: Cardiology

## 2013-12-10 ENCOUNTER — Ambulatory Visit: Payer: BC Managed Care – PPO | Admitting: Cardiology

## 2014-01-28 ENCOUNTER — Ambulatory Visit: Payer: BC Managed Care – PPO | Admitting: Cardiology

## 2014-01-28 DIAGNOSIS — M810 Age-related osteoporosis without current pathological fracture: Secondary | ICD-10-CM | POA: Insufficient documentation

## 2016-01-26 ENCOUNTER — Other Ambulatory Visit: Payer: Self-pay | Admitting: Nurse Practitioner

## 2016-01-26 DIAGNOSIS — N6459 Other signs and symptoms in breast: Secondary | ICD-10-CM

## 2016-01-28 ENCOUNTER — Ambulatory Visit
Admission: RE | Admit: 2016-01-28 | Discharge: 2016-01-28 | Disposition: A | Discharge status: Home / Self Care | Payer: BLUE CROSS/BLUE SHIELD | Source: Ambulatory Visit | Attending: Nurse Practitioner | Admitting: Nurse Practitioner

## 2016-01-28 DIAGNOSIS — N6459 Other signs and symptoms in breast: Secondary | ICD-10-CM

## 2016-01-30 ENCOUNTER — Other Ambulatory Visit: Payer: Self-pay

## 2016-03-30 ENCOUNTER — Emergency Department (HOSPITAL_COMMUNITY): Payer: BLUE CROSS/BLUE SHIELD

## 2016-03-30 ENCOUNTER — Encounter (HOSPITAL_COMMUNITY): Payer: Self-pay | Admitting: Emergency Medicine

## 2016-03-30 ENCOUNTER — Emergency Department (HOSPITAL_COMMUNITY)
Admission: EM | Admit: 2016-03-30 | Discharge: 2016-03-30 | Disposition: A | Discharge status: Home / Self Care | Payer: BLUE CROSS/BLUE SHIELD | Attending: Emergency Medicine | Admitting: Emergency Medicine

## 2016-03-30 DIAGNOSIS — Z79891 Long term (current) use of opiate analgesic: Secondary | ICD-10-CM | POA: Diagnosis not present

## 2016-03-30 DIAGNOSIS — S52102A Unspecified fracture of upper end of left radius, initial encounter for closed fracture: Secondary | ICD-10-CM | POA: Insufficient documentation

## 2016-03-30 DIAGNOSIS — E785 Hyperlipidemia, unspecified: Secondary | ICD-10-CM | POA: Insufficient documentation

## 2016-03-30 DIAGNOSIS — E7800 Pure hypercholesterolemia, unspecified: Secondary | ICD-10-CM | POA: Insufficient documentation

## 2016-03-30 DIAGNOSIS — Y929 Unspecified place or not applicable: Secondary | ICD-10-CM | POA: Diagnosis not present

## 2016-03-30 DIAGNOSIS — Z7982 Long term (current) use of aspirin: Secondary | ICD-10-CM | POA: Diagnosis not present

## 2016-03-30 DIAGNOSIS — W0110XA Fall on same level from slipping, tripping and stumbling with subsequent striking against unspecified object, initial encounter: Secondary | ICD-10-CM | POA: Diagnosis not present

## 2016-03-30 DIAGNOSIS — S62112A Displaced fracture of triquetrum [cuneiform] bone, left wrist, initial encounter for closed fracture: Secondary | ICD-10-CM | POA: Diagnosis not present

## 2016-03-30 DIAGNOSIS — Y990 Civilian activity done for income or pay: Secondary | ICD-10-CM | POA: Insufficient documentation

## 2016-03-30 DIAGNOSIS — Z79899 Other long term (current) drug therapy: Secondary | ICD-10-CM | POA: Insufficient documentation

## 2016-03-30 DIAGNOSIS — S52122A Displaced fracture of head of left radius, initial encounter for closed fracture: Secondary | ICD-10-CM

## 2016-03-30 DIAGNOSIS — I10 Essential (primary) hypertension: Secondary | ICD-10-CM | POA: Diagnosis not present

## 2016-03-30 DIAGNOSIS — R51 Headache: Secondary | ICD-10-CM | POA: Diagnosis not present

## 2016-03-30 DIAGNOSIS — S4992XA Unspecified injury of left shoulder and upper arm, initial encounter: Secondary | ICD-10-CM | POA: Diagnosis present

## 2016-03-30 DIAGNOSIS — F329 Major depressive disorder, single episode, unspecified: Secondary | ICD-10-CM | POA: Diagnosis not present

## 2016-03-30 DIAGNOSIS — Y9301 Activity, walking, marching and hiking: Secondary | ICD-10-CM | POA: Insufficient documentation

## 2016-03-30 MED ORDER — HYDROCODONE-ACETAMINOPHEN 5-325 MG PO TABS
2.0000 | ORAL_TABLET | Freq: Once | ORAL | Status: AC
  Administered 2016-03-30: 2 via ORAL
  Filled 2016-03-30: qty 2

## 2016-03-30 MED ORDER — HYDROCODONE-ACETAMINOPHEN 5-325 MG PO TABS: 1.0000 | ORAL_TABLET | Freq: Four times a day (QID) | ORAL | Status: DC | PRN

## 2016-03-30 NOTE — ED Provider Notes (Signed)
CSN: [id number]     Arrival date & time 03/30/16  1415 History   First MD Initiated Contact with Patient 03/30/16 1420     Chief Complaint  Patient presents with  . Fall  . Arm Injury  . Facial Pain     (Consider location/radiation/quality/duration/timing/severity/associated sxs/prior Treatment) HPI Comments: 65yo F w/ PMH including HTN, depression, HLD, chronic back pain who p/w L facial and L arm pain after a fall. Just prior to arrival, the patient was walking at her work and tripped over a cord, falling forward onto her face. She struck the left side of her face as well as her left arm. Currently, she endorses severe pain in her left arm, mainly around her elbow but the pain radiates up and down her arm. Normal sensation of her hand. She has some mild upper lip pain but denies any loose teeth or change in her bite. No visual changes, headache, neck pain, or loss of consciousness. She denies any back pain or injury to other extremities. No anticoagulant use.  Patient is a 65 y.o. female presenting with fall and arm injury. The history is provided by the patient.  Fall  Arm Injury   Past Medical History  Diagnosis Date  . Hypertension   . Depression   . Hypercholesteremia    Past Surgical History  Procedure Laterality Date  . Cesarean section    . Tonsillectomy     Family History  Problem Relation Age of Onset  . Hyperlipidemia Brother   . Coronary artery disease Brother 62    CABG 2013  . Peripheral vascular disease Brother   . Hypertension Mother   . Hypertension Father   . Hypertension Brother    Social History  Substance Use Topics  . Smoking status: Never Smoker   . Smokeless tobacco: Never Used  . Alcohol Use: No   OB History    No data available     Review of Systems 10 Systems reviewed and are negative for acute change except as noted in the HPI.    Allergies  Azithromycin; Codeine; and Lisinopril  Home Medications   Prior to Admission  medications   Medication Sig Start Date End Date Taking? Authorizing Provider  amLODipine (NORVASC) 10 MG tablet Take 10 mg by mouth daily.   Yes Historical Provider, MD  aspirin 81 MG chewable tablet Chew 81 mg by mouth daily.   Yes Historical Provider, MD  buPROPion (WELLBUTRIN XL) 300 MG 24 hr tablet Take 300 mg by mouth daily.   Yes Historical Provider, MD  cholecalciferol (VITAMIN D) 1000 UNITS tablet Take 1,000 Units by mouth daily.   Yes Historical Provider, MD  clorazepate (TRANXENE) 7.5 MG tablet Take 7.5 mg by mouth 2 (two) times daily.    Yes Historical Provider, MD  fish oil-omega-3 fatty acids 1000 MG capsule Take 1 g by mouth daily.   Yes Historical Provider, MD  ketotifen (ZADITOR) 0.025 % ophthalmic solution Place 1 drop into both eyes 2 (two) times daily.   Yes Historical Provider, MD  metoprolol succinate (TOPROL XL) 25 MG 24 hr tablet 1/2 tablet (total 12.51m) daily 06/05/13  Yes DLarey Dresser MD  olmesartan (BENICAR) 40 MG tablet Take 40 mg by mouth daily.   Yes Historical Provider, MD  POTASSIUM PO Take 1 tablet by mouth daily.   Yes Historical Provider, MD  RABEprazole (ACIPHEX) 20 MG tablet Take 20 mg by mouth 2 (two) times daily.    Yes Historical Provider, MD  raloxifene (EVISTA) 60 MG tablet Take 60 mg by mouth daily.   Yes Historical Provider, MD  Red Yeast Rice 600 MG CAPS Take 1 capsule by mouth 2 (two) times daily.   Yes Historical Provider, MD  traZODone (DESYREL) 50 MG tablet Take 50 mg by mouth at bedtime.   Yes Historical Provider, MD  vitamin C (ASCORBIC ACID) 500 MG tablet Take 500 mg by mouth daily.   Yes Historical Provider, MD  vitamin E 400 UNIT capsule Take 400 Units by mouth daily.   Yes Historical Provider, MD  HYDROcodone-acetaminophen (NORCO/VICODIN) 5-325 MG tablet Take 1-2 tablets by mouth every 6 (six) hours as needed for severe pain. 03/30/16   Wenda Overland Marlayna Bannister, MD   BP 141/96 mmHg  Pulse 86  Temp(Src) 97.5 F (36.4 C) (Oral)  Resp 18   SpO2 99% Physical Exam  Constitutional: She is oriented to person, place, and time. She appears well-developed and well-nourished. No distress.  HENT:  Head:    Right Ear: Tympanic membrane and ear canal normal.  Left Ear: Tympanic membrane and ear canal normal.  Nose: No nasal deformity or septal deviation.  Moist mucous membranes, laceration to the central upper lip on the inside without external lip involvement; ) right naris; ecchymosis L cheek below eye; normal sensation throughout face  Eyes: Conjunctivae and EOM are normal. Pupils are equal, round, and reactive to light.  Neck:  In c-collar  Cardiovascular: Normal rate, regular rhythm and normal heart sounds.   No murmur heard. Pulmonary/Chest: Effort normal and breath sounds normal.  Abdominal: Soft. Bowel sounds are normal. She exhibits no distension. There is no tenderness.  Musculoskeletal: She exhibits edema and tenderness.  Mild edema at L elbow w/ TTP medial epicondyle, proximal forearm w/ arm held in 90 degrees flexion; no swelling or tenderness of L  shoulder; normal sensation fingerS; tenderness of ulnar side of dorsal L wrist without obvious deformity, normal ROM  Neurological: She is alert and oriented to person, place, and time. No cranial nerve deficit.  Fluent speech  Skin: Skin is warm and dry. No pallor.  Psychiatric: She has a normal mood and affect. Judgment normal.  Nursing note and vitals reviewed.   ED Course  Procedures (including critical care time) Labs Review Labs Reviewed - No data to display  Imaging Review Dg Elbow Complete Left  03/30/2016  CLINICAL DATA:  Status post fall with left humerus and elbow pain. EXAM: LEFT ELBOW - COMPLETE 3+ VIEW COMPARISON:  None. FINDINGS: There is questioned subtle fracture the radial head. There is no dislocation. There is no evidence of arthropathy or other focal bone abnormality. Soft tissues are unremarkable. IMPRESSION: Question subtle fracture of the radial  head. Electronically Signed   By: Abelardo Diesel M.D.   On: 03/30/2016 15:24   Dg Forearm Left  03/30/2016  CLINICAL DATA:  Status post fall with left elbow and left humerus pain. EXAM: LEFT FOREARM - 2 VIEW COMPARISON:  None. FINDINGS: There is question subluxation of the first metacarpal-carpal joint. There is question fracture of the triquetrum. The question subtle abnormality of the radial head is not as well seen on the two-views of the radius and ulna. Please refer to the left elbow series for further comment on this. Soft tissues are unremarkable. IMPRESSION: Question subluxation of the first metacarpal-carpal joint. Question fracture triquetrum. Further evaluation with left hand x-ray series is recommended. Electronically Signed   By: Abelardo Diesel M.D.   On: 03/30/2016 15:23   Ct  Head Wo Contrast  03/30/2016  CLINICAL DATA:  Left-sided facial swelling after fall. EXAM: CT HEAD WITHOUT CONTRAST CT MAXILLOFACIAL WITHOUT CONTRAST CT CERVICAL SPINE WITHOUT CONTRAST TECHNIQUE: Multidetector CT imaging of the head, cervical spine, and maxillofacial structures were performed using the standard protocol without intravenous contrast. Multiplanar CT image reconstructions of the cervical spine and maxillofacial structures were also generated. COMPARISON:  CT scan of Mar 30, 2012. FINDINGS: CT HEAD FINDINGS Bony calvarium appears intact. No mass effect or midline shift is noted. Ventricular size is within normal limits. There is no evidence of mass lesion, hemorrhage or acute infarction. CT MAXILLOFACIAL FINDINGS No fracture or bony abnormality is noted. Paranasal sinuses appear normal. Pterygoid plates appear normal. Globes and orbits appear normal. CT CERVICAL SPINE FINDINGS No fracture or spondylolisthesis is noted. Mild degenerative disc disease is noted at C5-6 and C6-7 with anterior osteophyte formation. IMPRESSION: Normal head CT. No abnormality seen in maxillofacial region. Mild multilevel degenerative  disc disease is noted. No acute abnormality seen in the cervical spine. Electronically Signed   By: Marijo Conception, M.D.   On: 03/30/2016 15:55   Ct Cervical Spine Wo Contrast  03/30/2016  CLINICAL DATA:  Left-sided facial swelling after fall. EXAM: CT HEAD WITHOUT CONTRAST CT MAXILLOFACIAL WITHOUT CONTRAST CT CERVICAL SPINE WITHOUT CONTRAST TECHNIQUE: Multidetector CT imaging of the head, cervical spine, and maxillofacial structures were performed using the standard protocol without intravenous contrast. Multiplanar CT image reconstructions of the cervical spine and maxillofacial structures were also generated. COMPARISON:  CT scan of Mar 30, 2012. FINDINGS: CT HEAD FINDINGS Bony calvarium appears intact. No mass effect or midline shift is noted. Ventricular size is within normal limits. There is no evidence of mass lesion, hemorrhage or acute infarction. CT MAXILLOFACIAL FINDINGS No fracture or bony abnormality is noted. Paranasal sinuses appear normal. Pterygoid plates appear normal. Globes and orbits appear normal. CT CERVICAL SPINE FINDINGS No fracture or spondylolisthesis is noted. Mild degenerative disc disease is noted at C5-6 and C6-7 with anterior osteophyte formation. IMPRESSION: Normal head CT. No abnormality seen in maxillofacial region. Mild multilevel degenerative disc disease is noted. No acute abnormality seen in the cervical spine. Electronically Signed   By: Marijo Conception, M.D.   On: 03/30/2016 15:55   Dg Humerus Left  03/30/2016  CLINICAL DATA:  Status post fall with left elbow with and left humerus pain. EXAM: LEFT HUMERUS - 2+ VIEW COMPARISON:  None. FINDINGS: There is no evidence of fracture or other focal bone lesions. Soft tissues are unremarkable. IMPRESSION: Negative. Electronically Signed   By: Abelardo Diesel M.D.   On: 03/30/2016 15:21   Dg Hand Complete Left  03/30/2016  CLINICAL DATA:  Fall. Concern for hand injuries on forearm radiographs. EXAM: LEFT HAND - COMPLETE 3+ VIEW  COMPARISON:  Forearm 03/30/2016 FINDINGS: Radiopaque ring on the fourth finger. Osteophytes and degenerative changes at the first carpometacarpal joint. There is a small bone fragment along the dorsal aspect of the carpal bones which could represent an avulsion fracture of unknown age. No other definite fractures. Chondrocalcinosis along the ulnar aspect of the wrist. IMPRESSION: Bone fragment along the dorsal aspect of the carpal bones. This could represent a triquetrum avulsion fracture of unknown age. No other definite fractures. 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 Electronically Signed   By: Markus Daft M.D.   On: 03/30/2016 15:59   Ct Maxillofacial Wo Cm  03/30/2016  CLINICAL DATA:  Left-sided facial swelling after fall. EXAM: CT HEAD WITHOUT  CONTRAST CT MAXILLOFACIAL WITHOUT CONTRAST CT CERVICAL SPINE WITHOUT CONTRAST TECHNIQUE: Multidetector CT imaging of the head, cervical spine, and maxillofacial structures were performed using the standard protocol without intravenous contrast. Multiplanar CT image reconstructions of the cervical spine and maxillofacial structures were also generated. COMPARISON:  CT scan of Mar 30, 2012. FINDINGS: CT HEAD FINDINGS Bony calvarium appears intact. No mass effect or midline shift is noted. Ventricular size is within normal limits. There is no evidence of mass lesion, hemorrhage or acute infarction. CT MAXILLOFACIAL FINDINGS No fracture or bony abnormality is noted. Paranasal sinuses appear normal. Pterygoid plates appear normal. Globes and orbits appear normal. CT CERVICAL SPINE FINDINGS No fracture or spondylolisthesis is noted. Mild degenerative disc disease is noted at C5-6 and C6-7 with anterior osteophyte formation. IMPRESSION: Normal head CT. No abnormality seen in maxillofacial region. Mild multilevel degenerative disc disease is noted. No acute abnormality seen in the cervical spine. Electronically Signed   By: Marijo Conception, M.D.   On:  03/30/2016 15:55   I have personally reviewed and evaluated these images as part of my medical decision-making.   EKG Interpretation None      MDM   Final diagnoses:  Radial head fracture, left, closed, initial encounter  Fracture of triquetrum of left wrist, closed, initial encounter   Pt p/w facial pain, L elbow pain after falling forward onto face from standing. On exam, she had L cheek and upper lip swelling w/ internal laceration of upper lip, No external involvement, dried blood in the right naris, left cheek ecchymosis, and left elbow pain particularly around medial epicondyles. Obtained plain films of left arm and elbow as well as CT of head, C-spine, and face given her trauma and exam. Her lip laceration is not external and given that it only involves mucous membranes, I feel that it will heal appropriately without suturing. Patient agreement with this plan.  CTs showed no acute injuries with the exception of soft tissue swelling. Pain films show possible nondisplaced radial head fracture and small avulsion triquetrum fracture. Because of the patient's location of pain, we will treat with immobilization for a triquetral fracture and sling for radial head fracture. Placed in splint and arm sling. I have discussed supportive care instructions for both and provided with follow-up information for hand surgery in one week for reevaluation. Provided with Norco to use as needed for pain. Discussed supportive care including elevation, ice, and early range of motion of elbow to prevent stiffness. Patient voiced understanding of plan as well as return precautions regarding her arm as well as her head injury. Patient discharged in satisfactory condition.  Sharlett Iles, MD 03/30/16 [phone]

## 2016-03-30 NOTE — ED Notes (Signed)
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  Patient fell face forward hitting left arm-pt c/o left forearm pain and hitting her face--pt had right nostril nose bleed which is controlled at this time and upper inside lip laceration--bleeding controlled at this time as well.  Patient denies LOC. Patient takes ASA 1m daily.

## 2016-03-30 NOTE — ED Notes (Signed)
Ortho made aware of new orders

## 2016-03-30 NOTE — Discharge Instructions (Signed)
YOU ALSO MAY HAVE A SMALL FRACTURE OF A BONE IN YOUR WRIST CALLED THE TRIQUETRUM. PLEASE KEEP THE SPLINT ON AND AVOID GETTING IT WET UNTIL FOLLOW UP WITH THE HAND SURGEON.

## 2018-09-27 LAB — GLUCOSE, POCT (MANUAL RESULT ENTRY): POC Glucose: 110 mg/dl — AB (ref 70–99)

## 2019-09-18 ENCOUNTER — Other Ambulatory Visit: Payer: Self-pay

## 2019-09-18 DIAGNOSIS — Z20822 Contact with and (suspected) exposure to covid-19: Secondary | ICD-10-CM

## 2019-09-19 LAB — NOVEL CORONAVIRUS, NAA: SARS-CoV-2, NAA: NOT DETECTED

## 2020-12-15 ENCOUNTER — Other Ambulatory Visit: Payer: Self-pay | Admitting: Orthopedic Surgery

## 2020-12-15 NOTE — Progress Notes (Signed)
Am DUE TO COVID-19 ONLY ONE VISITOR IS ALLOWED TO COME WITH YOU AND STAY IN THE WAITING ROOM ONLY DURING PRE OP AND PROCEDURE DAY OF SURGERY. THE 1 VISITOR  MAY VISIT WITH YOU AFTER SURGERY IN YOUR PRIVATE ROOM DURING VISITING HOURS ONLY!  YOU NEED TO HAVE A COVID 19 TEST ON___2/12/2020 ____ @____0950___ , THIS TEST MUST BE DONE BEFORE SURGERY,  COVID TESTING SITE [address] [place] [address], IT IS ON THE RIGHT GOING OUT [address] APPROXIMATELY  2 MINUTES PAST [employer] ON THE RIGHT. ONCE YOUR COVID TEST IS COMPLETED,  PLEASE BEGIN THE QUARANTINE INSTRUCTIONS AS OUTLINED IN YOUR HANDOUT.                Erika Luna  12/15/2020   Your procedure is scheduled on: 12/18/20   Report to [facility] Main  Entrance   Report to admitting at      1025 AM     Call this number if you have problems the morning of surgery [phone]    REMEMBER: NO  SOLID FOOD CANDY OR GUM AFTER MIDNIGHT. CLEAR LIQUIDS UNTIL         . NOTHING BY MOUTH EXCEPT CLEAR LIQUIDS UNTIL     0925am    . PLEASE FINISH ENSURE DRINK PER SURGEON ORDER  WHICH NEEDS TO BE COMPLETED AT  0925 am       CLEAR LIQUID DIET   Foods Allowed                                                                    Coffee and tea, regular and decaf                            Fruit ices (not with fruit pulp)                                      Iced Popsicles                                    Carbonated beverages, regular and diet                                    Cranberry, grape and apple juices Sports drinks like Gatorade Lightly seasoned clear broth or consume(fat free) Sugar, honey syrup ___________________________________________________________________      BRUSH YOUR TEETH MORNING OF SURGERY AND RINSE YOUR MOUTH OUT, NO CHEWING GUM CANDY OR MINTS.     Take these medicines the morning of surgery with A SIP OF WATER: amlodipine, wellbutrin, metoprolol, protonix, evista   DO NOT TAKE  ANY DIABETIC MEDICATIONS DAY OF YOUR SURGERY                               You may not have any metal on your body including hair pins and  piercings  Do not wear jewelry, make-up, lotions, powders or perfumes, deodorant             Do not wear nail polish on your fingernails.  Do not shave  48 hours prior to surgery.              Men may shave face and neck.   Do not bring valuables to the hospital. [place].  Contacts, dentures or bridgework may not be worn into surgery.  Leave suitcase in the car. After surgery it may be brought to your room.     Patients discharged the day of surgery will not be allowed to drive home. IF YOU ARE HAVING SURGERY AND GOING HOME THE SAME DAY, YOU MUST HAVE AN ADULT TO DRIVE YOU HOME AND BE WITH YOU FOR 24 HOURS. YOU MAY GO HOME BY TAXI OR UBER OR ORTHERWISE, BUT AN ADULT MUST ACCOMPANY YOU HOME AND STAY WITH YOU FOR 24 HOURS.  Name and phone number of your driver:  Special Instructions: N/A              Please read over the following fact sheets you were given: _____________________________________________________________________  [facility] - Preparing for Surgery Before surgery, you can play an important role.  Because skin is not sterile, your skin needs to be as free of germs as possible.  You can reduce the number of germs on your skin by washing with CHG (chlorahexidine gluconate) soap before surgery.  CHG is an antiseptic cleaner which kills germs and bonds with the skin to continue killing germs even after washing. Please DO NOT use if you have an allergy to CHG or antibacterial soaps.  If your skin becomes reddened/irritated stop using the CHG and inform your nurse when you arrive at Short Stay. Do not shave (including legs and underarms) for at least 48 hours prior to the first CHG shower.  You may shave your face/neck. Please follow these instructions carefully:  1.  Shower with CHG  Soap the night before surgery and the  morning of Surgery.  2.  If you choose to wash your hair, wash your hair first as usual with your  normal  shampoo.  3.  After you shampoo, rinse your hair and body thoroughly to remove the  shampoo.                           4.  Use CHG as you would any other liquid soap.  You can apply chg directly  to the skin and wash                       Gently with a scrungie or clean washcloth.  5.  Apply the CHG Soap to your body ONLY FROM THE NECK DOWN.   Do not use on face/ open                           Wound or open sores. Avoid contact with eyes, ears mouth and genitals (private parts).                       Wash face,  Genitals (private parts) with your normal soap.             6.  Wash thoroughly, paying special attention to the area where your surgery  will be performed.  7.  Thoroughly rinse your body with warm water from the neck down.  8.  DO NOT shower/wash with your normal soap after using and rinsing off  the CHG Soap.                9.  Pat yourself dry with a clean towel.            10.  Wear clean pajamas.            11.  Place clean sheets on your bed the night of your first shower and do not  sleep with pets. Day of Surgery : Do not apply any lotions/deodorants the morning of surgery.  Please wear clean clothes to the hospital/surgery center.  FAILURE TO FOLLOW THESE INSTRUCTIONS MAY RESULT IN THE CANCELLATION OF YOUR SURGERY PATIENT SIGNATURE_________________________________  NURSE SIGNATURE__________________________________  ________________________________________________________________________

## 2020-12-17 ENCOUNTER — Encounter (HOSPITAL_COMMUNITY): Payer: Self-pay

## 2020-12-17 ENCOUNTER — Other Ambulatory Visit: Payer: Self-pay

## 2020-12-17 ENCOUNTER — Encounter (HOSPITAL_COMMUNITY)
Admission: RE | Admit: 2020-12-17 | Discharge: 2020-12-17 | Disposition: A | Discharge status: Home / Self Care | Payer: Medicare Other | Source: Ambulatory Visit | Attending: Orthopedic Surgery | Admitting: Orthopedic Surgery

## 2020-12-17 ENCOUNTER — Other Ambulatory Visit (HOSPITAL_COMMUNITY)
Admission: RE | Admit: 2020-12-17 | Discharge: 2020-12-17 | Disposition: A | Discharge status: Home / Self Care | Payer: Medicare Other | Source: Ambulatory Visit | Attending: Orthopedic Surgery | Admitting: Orthopedic Surgery

## 2020-12-17 DIAGNOSIS — Z20822 Contact with and (suspected) exposure to covid-19: Secondary | ICD-10-CM | POA: Insufficient documentation

## 2020-12-17 DIAGNOSIS — Z01818 Encounter for other preprocedural examination: Secondary | ICD-10-CM | POA: Insufficient documentation

## 2020-12-17 HISTORY — DX: Gastro-esophageal reflux disease without esophagitis: K21.9

## 2020-12-17 HISTORY — DX: Anxiety disorder, unspecified: F41.9

## 2020-12-17 LAB — CBC
HCT: 32 % — ABNORMAL LOW (ref 36.0–46.0)
Hemoglobin: 10.8 g/dL — ABNORMAL LOW (ref 12.0–15.0)
MCH: 29.7 pg (ref 26.0–34.0)
MCHC: 33.8 g/dL (ref 30.0–36.0)
MCV: 87.9 fL (ref 80.0–100.0)
Platelets: 274 10*3/uL (ref 150–400)
RBC: 3.64 MIL/uL — ABNORMAL LOW (ref 3.87–5.11)
RDW: 14.4 % (ref 11.5–15.5)
WBC: 10.6 10*3/uL — ABNORMAL HIGH (ref 4.0–10.5)
nRBC: 0 % (ref 0.0–0.2)

## 2020-12-17 LAB — BASIC METABOLIC PANEL
Anion gap: 10 (ref 5–15)
BUN: 23 mg/dL (ref 8–23)
CO2: 25 mmol/L (ref 22–32)
Calcium: 8.8 mg/dL — ABNORMAL LOW (ref 8.9–10.3)
Chloride: 98 mmol/L (ref 98–111)
Creatinine, Ser: 0.91 mg/dL (ref 0.44–1.00)
GFR, Estimated: >60 mL/min (ref >60)
Glucose, Bld: 117 mg/dL — ABNORMAL HIGH (ref 70–99)
Potassium: 3.6 mmol/L (ref 3.5–5.1)
Sodium: 133 mmol/L — ABNORMAL LOW (ref 135–145)

## 2020-12-17 LAB — SARS CORONAVIRUS 2 (TAT 6-24 HRS): SARS Coronavirus 2: NEGATIVE

## 2020-12-17 NOTE — Progress Notes (Signed)
At time of preo pappt pt reports being in pain with right arm and being nervous.  Pulse 111 at preop.  Pt voices no other complaints.

## 2020-12-17 NOTE — Progress Notes (Addendum)
Anesthesia Review:  PCP: DR Bradly Bienenstock- 10/13/20 LOV  Cardiologist : Chest x-ray : EKG :12/17/20 Echo : Stress test: Cardiac Cath :  Activity level: can do a flight of stairs without difficulty  Sleep Study/ CPAP : no  Fasting Blood Sugar :      / Checks Blood Sugar -- times a day:   Blood Thinner/ Instructions /Last Dose: ASA / Instructions/ Last Dose :  Heart rate at preop was 111.  Pt complaineid of irght arm pain and being nervous.  Voiced no other complaints.   CBC done 12/17/20 routed to DR chandler.

## 2020-12-17 NOTE — Anesthesia Preprocedure Evaluation (Signed)
Anesthesia Evaluation  Patient identified by MRN, date of birth, ID band Patient awake    Reviewed: Allergy & Precautions, NPO status , Patient's Chart, lab work & pertinent test results, reviewed documented beta blocker date and time   History of Anesthesia Complications Negative for: history of anesthetic complications  Airway Mallampati: II  TM Distance: >3 FB Neck ROM: Full    Dental no notable dental hx.    Pulmonary neg pulmonary ROS,    Pulmonary exam normal        Cardiovascular hypertension, Pt. on medications and Pt. on home beta blockers Normal cardiovascular exam     Neuro/Psych Anxiety Depression negative neurological ROS     GI/Hepatic Neg liver ROS, GERD  Controlled and Medicated,  Endo/Other  negative endocrine ROS  Renal/GU negative Renal ROS  negative genitourinary   Musculoskeletal Right proximal humerus fracture   Abdominal   Peds  Hematology  (+) anemia ,   Anesthesia Other Findings Day of surgery medications reviewed with patient.  Reproductive/Obstetrics negative OB ROS                            Anesthesia Physical Anesthesia Plan  ASA: II  Anesthesia Plan: General   Post-op Pain Management: GA combined w/ Regional for post-op pain   Induction: Intravenous  PONV Risk Score and Plan: 3 and Treatment may vary due to age or medical condition, Ondansetron, Dexamethasone and Midazolam  Airway Management Planned: Oral ETT  Additional Equipment: None  Intra-op Plan:   Post-operative Plan: Extubation in OR  Informed Consent: I have reviewed the patients History and Physical, chart, labs and discussed the procedure including the risks, benefits and alternatives for the proposed anesthesia with the patient or authorized representative who has indicated his/her understanding and acceptance.     Dental advisory given  Plan Discussed with: CRNA  Anesthesia  Plan Comments:        Anesthesia Quick Evaluation

## 2020-12-18 ENCOUNTER — Ambulatory Visit (HOSPITAL_COMMUNITY)
Admission: RE | Admit: 2020-12-18 | Discharge: 2020-12-18 | Disposition: A | Discharge status: Home / Self Care | Payer: Medicare Other | Attending: Orthopedic Surgery | Admitting: Orthopedic Surgery

## 2020-12-18 ENCOUNTER — Ambulatory Visit (HOSPITAL_COMMUNITY): Payer: Medicare Other | Admitting: Physician Assistant

## 2020-12-18 ENCOUNTER — Ambulatory Visit (HOSPITAL_COMMUNITY): Payer: Medicare Other

## 2020-12-18 ENCOUNTER — Encounter (HOSPITAL_COMMUNITY): Payer: Self-pay | Admitting: Orthopedic Surgery

## 2020-12-18 ENCOUNTER — Other Ambulatory Visit: Payer: Self-pay

## 2020-12-18 ENCOUNTER — Ambulatory Visit (HOSPITAL_COMMUNITY): Payer: Medicare Other | Admitting: Anesthesiology

## 2020-12-18 ENCOUNTER — Encounter (HOSPITAL_COMMUNITY)
Admission: RE | Disposition: A | Discharge status: Home / Self Care | Payer: Self-pay | Source: Home / Self Care | Attending: Orthopedic Surgery

## 2020-12-18 DIAGNOSIS — W19XXXA Unspecified fall, initial encounter: Secondary | ICD-10-CM | POA: Diagnosis not present

## 2020-12-18 DIAGNOSIS — Z888 Allergy status to other drugs, medicaments and biological substances status: Secondary | ICD-10-CM | POA: Diagnosis not present

## 2020-12-18 DIAGNOSIS — Z7982 Long term (current) use of aspirin: Secondary | ICD-10-CM | POA: Diagnosis not present

## 2020-12-18 DIAGNOSIS — S42201A Unspecified fracture of upper end of right humerus, initial encounter for closed fracture: Secondary | ICD-10-CM | POA: Diagnosis present

## 2020-12-18 DIAGNOSIS — I10 Essential (primary) hypertension: Secondary | ICD-10-CM | POA: Diagnosis not present

## 2020-12-18 DIAGNOSIS — Z79899 Other long term (current) drug therapy: Secondary | ICD-10-CM | POA: Insufficient documentation

## 2020-12-18 DIAGNOSIS — Z419 Encounter for procedure for purposes other than remedying health state, unspecified: Secondary | ICD-10-CM

## 2020-12-18 HISTORY — PX: ORIF HUMERUS FRACTURE: SHX2126

## 2020-12-18 SURGERY — OPEN REDUCTION INTERNAL FIXATION (ORIF) PROXIMAL HUMERUS FRACTURE
Anesthesia: General | Laterality: Right

## 2020-12-18 MED ORDER — ROCURONIUM BROMIDE 10 MG/ML (PF) SYRINGE
PREFILLED_SYRINGE | INTRAVENOUS | Status: AC
  Filled 2020-12-18: qty 10

## 2020-12-18 MED ORDER — FENTANYL CITRATE (PF) 250 MCG/5ML IJ SOLN
INTRAMUSCULAR | Status: AC
  Filled 2020-12-18: qty 5

## 2020-12-18 MED ORDER — PROMETHAZINE HCL 25 MG/ML IJ SOLN
6.2500 mg | INTRAMUSCULAR | Status: DC | PRN
Start: 2020-12-18 — End: 2020-12-18

## 2020-12-18 MED ORDER — OXYCODONE-ACETAMINOPHEN 5-325 MG PO TABS: ORAL_TABLET | ORAL | 0 refills | Status: DC

## 2020-12-18 MED ORDER — PHENYLEPHRINE 40 MCG/ML (10ML) SYRINGE FOR IV PUSH (FOR BLOOD PRESSURE SUPPORT)
PREFILLED_SYRINGE | INTRAVENOUS | Status: AC
  Filled 2020-12-18: qty 20

## 2020-12-18 MED ORDER — TRANEXAMIC ACID-NACL 1000-0.7 MG/100ML-% IV SOLN
INTRAVENOUS | Status: DC | PRN
  Administered 2020-12-18: 1000 mg via INTRAVENOUS

## 2020-12-18 MED ORDER — LIDOCAINE HCL (CARDIAC) PF 100 MG/5ML IV SOSY
PREFILLED_SYRINGE | INTRAVENOUS | Status: DC | PRN
  Administered 2020-12-18: 60 mg via INTRAVENOUS

## 2020-12-18 MED ORDER — DEXAMETHASONE SODIUM PHOSPHATE 10 MG/ML IJ SOLN
INTRAMUSCULAR | Status: DC | PRN
  Administered 2020-12-18: 10 mg via INTRAVENOUS

## 2020-12-18 MED ORDER — FENTANYL CITRATE (PF) 250 MCG/5ML IJ SOLN
INTRAMUSCULAR | Status: DC | PRN
  Administered 2020-12-18: 100 ug via INTRAVENOUS

## 2020-12-18 MED ORDER — PHENYLEPHRINE 40 MCG/ML (10ML) SYRINGE FOR IV PUSH (FOR BLOOD PRESSURE SUPPORT)
PREFILLED_SYRINGE | INTRAVENOUS | Status: DC | PRN
  Administered 2020-12-18: 160 ug via INTRAVENOUS
  Administered 2020-12-18: 80 ug via INTRAVENOUS

## 2020-12-18 MED ORDER — ONDANSETRON HCL 4 MG/2ML IJ SOLN
INTRAMUSCULAR | Status: DC | PRN
  Administered 2020-12-18: 4 mg via INTRAVENOUS

## 2020-12-18 MED ORDER — BUPIVACAINE LIPOSOME 1.3 % IJ SUSP
INTRAMUSCULAR | Status: DC | PRN
  Administered 2020-12-18: 10 mL via PERINEURAL

## 2020-12-18 MED ORDER — OXYCODONE HCL 5 MG/5ML PO SOLN: 5.0000 mg | Freq: Once | ORAL | Status: DC | PRN

## 2020-12-18 MED ORDER — ONDANSETRON HCL 4 MG/2ML IJ SOLN
INTRAMUSCULAR | Status: AC
  Filled 2020-12-18: qty 2

## 2020-12-18 MED ORDER — MIDAZOLAM HCL 2 MG/2ML IJ SOLN
1.0000 mg | Freq: Once | INTRAMUSCULAR | Status: DC
  Filled 2020-12-18: qty 2

## 2020-12-18 MED ORDER — MIDAZOLAM HCL 2 MG/2ML IJ SOLN
INTRAMUSCULAR | Status: AC
  Filled 2020-12-18: qty 2

## 2020-12-18 MED ORDER — FENTANYL CITRATE (PF) 100 MCG/2ML IJ SOLN
50.0000 ug | Freq: Once | INTRAMUSCULAR | Status: AC
  Administered 2020-12-18: 75 ug via INTRAVENOUS
  Filled 2020-12-18: qty 2

## 2020-12-18 MED ORDER — BUPIVACAINE-EPINEPHRINE (PF) 0.5% -1:200000 IJ SOLN
INTRAMUSCULAR | Status: DC | PRN
  Administered 2020-12-18: 15 mL via PERINEURAL

## 2020-12-18 MED ORDER — SUGAMMADEX SODIUM 200 MG/2ML IV SOLN
INTRAVENOUS | Status: DC | PRN
  Administered 2020-12-18: 150 mg via INTRAVENOUS

## 2020-12-18 MED ORDER — ROCURONIUM BROMIDE 10 MG/ML (PF) SYRINGE
PREFILLED_SYRINGE | INTRAVENOUS | Status: DC | PRN
  Administered 2020-12-18: 60 mg via INTRAVENOUS

## 2020-12-18 MED ORDER — ORAL CARE MOUTH RINSE: 15.0000 mL | Freq: Once | OROMUCOSAL | Status: AC

## 2020-12-18 MED ORDER — PHENYLEPHRINE HCL (PRESSORS) 10 MG/ML IV SOLN
INTRAVENOUS | Status: AC
  Filled 2020-12-18: qty 2

## 2020-12-18 MED ORDER — PROPOFOL 10 MG/ML IV BOLUS
INTRAVENOUS | Status: DC | PRN
  Administered 2020-12-18: 130 mg via INTRAVENOUS

## 2020-12-18 MED ORDER — CEFAZOLIN SODIUM-DEXTROSE 2-4 GM/100ML-% IV SOLN
2.0000 g | INTRAVENOUS | Status: AC
  Administered 2020-12-18: 2 g via INTRAVENOUS
  Filled 2020-12-18: qty 100

## 2020-12-18 MED ORDER — EPHEDRINE 5 MG/ML INJ
INTRAVENOUS | Status: AC
  Filled 2020-12-18: qty 30

## 2020-12-18 MED ORDER — TIZANIDINE HCL 4 MG PO TABS: 4.0000 mg | ORAL_TABLET | Freq: Three times a day (TID) | ORAL | 1 refills | Status: DC | PRN

## 2020-12-18 MED ORDER — ACETAMINOPHEN 500 MG PO TABS
1000.0000 mg | ORAL_TABLET | Freq: Once | ORAL | Status: AC
  Administered 2020-12-18: 1000 mg via ORAL
  Filled 2020-12-18: qty 2

## 2020-12-18 MED ORDER — EPHEDRINE SULFATE 50 MG/ML IJ SOLN
INTRAMUSCULAR | Status: DC | PRN
  Administered 2020-12-18: 8 mg via INTRAVENOUS

## 2020-12-18 MED ORDER — PHENYLEPHRINE HCL-NACL 10-0.9 MG/250ML-% IV SOLN
INTRAVENOUS | Status: DC | PRN
  Administered 2020-12-18: 30 ug/min via INTRAVENOUS

## 2020-12-18 MED ORDER — OXYCODONE HCL 5 MG PO TABS: 5.0000 mg | ORAL_TABLET | Freq: Once | ORAL | Status: DC | PRN

## 2020-12-18 MED ORDER — TRANEXAMIC ACID-NACL 1000-0.7 MG/100ML-% IV SOLN
INTRAVENOUS | Status: AC
  Filled 2020-12-18: qty 100

## 2020-12-18 MED ORDER — LACTATED RINGERS IV SOLN: INTRAVENOUS | Status: DC

## 2020-12-18 MED ORDER — FENTANYL CITRATE (PF) 100 MCG/2ML IJ SOLN
25.0000 ug | INTRAMUSCULAR | Status: DC | PRN
Start: 2020-12-18 — End: 2020-12-18

## 2020-12-18 MED ORDER — 0.9 % SODIUM CHLORIDE (POUR BTL) OPTIME
TOPICAL | Status: DC | PRN
  Administered 2020-12-18: 1000 mL

## 2020-12-18 MED ORDER — LIDOCAINE HCL (PF) 2 % IJ SOLN
INTRAMUSCULAR | Status: AC
  Filled 2020-12-18: qty 5

## 2020-12-18 MED ORDER — PROPOFOL 10 MG/ML IV BOLUS
INTRAVENOUS | Status: AC
  Filled 2020-12-18: qty 20

## 2020-12-18 MED ORDER — DEXAMETHASONE SODIUM PHOSPHATE 10 MG/ML IJ SOLN
INTRAMUSCULAR | Status: AC
  Filled 2020-12-18: qty 1

## 2020-12-18 MED ORDER — CHLORHEXIDINE GLUCONATE 0.12 % MT SOLN
15.0000 mL | Freq: Once | OROMUCOSAL | Status: AC
  Administered 2020-12-18: 15 mL via OROMUCOSAL

## 2020-12-18 SURGICAL SUPPLY — 52 items
BAG ZIPLOCK 12X15 (MISCELLANEOUS) ×2 IMPLANT
BIT DRILL 3X200 (DRILL) ×2 IMPLANT
CLSR STERI-STRIP ANTIMIC 1/2X4 (GAUZE/BANDAGES/DRESSINGS) ×2 IMPLANT
COOLER ICEMAN CLASSIC (MISCELLANEOUS) ×2 IMPLANT
COVER SURGICAL LIGHT HANDLE (MISCELLANEOUS) ×2 IMPLANT
COVER WAND RF STERILE (DRAPES) IMPLANT
DRAPE C-ARM 42X120 X-RAY (DRAPES) ×2 IMPLANT
DRAPE INCISE IOBAN 66X45 STRL (DRAPES) IMPLANT
DRAPE ORTHO SPLIT 77X108 STRL (DRAPES) ×4
DRAPE POUCH INSTRU U-SHP 10X18 (DRAPES) ×2 IMPLANT
DRAPE SURG 17X11 SM STRL (DRAPES) ×2 IMPLANT
DRAPE SURG ORHT 6 SPLT 77X108 (DRAPES) ×2 IMPLANT
DRAPE U-SHAPE 47X51 STRL (DRAPES) ×2 IMPLANT
DRESSING AQUACEL AG SP 3.5X10 (GAUZE/BANDAGES/DRESSINGS) ×1 IMPLANT
DRSG AQUACEL AG SP 3.5X10 (GAUZE/BANDAGES/DRESSINGS) ×2
DRSG MEPILEX BORDER 4X4 (GAUZE/BANDAGES/DRESSINGS) ×6 IMPLANT
DURAPREP 26ML APPLICATOR (WOUND CARE) ×2 IMPLANT
ELECT REM PT RETURN 15FT ADLT (MISCELLANEOUS) ×2 IMPLANT
GAUZE SPONGE 2X2 8PLY STRL LF (GAUZE/BANDAGES/DRESSINGS) IMPLANT
GLOVE BIO SURGEON STRL SZ7.5 (GLOVE) ×2 IMPLANT
GLOVE SRG 8 PF TXTR STRL LF DI (GLOVE) ×1 IMPLANT
GLOVE SURG ENC MOIS LTX SZ7 (GLOVE) ×2 IMPLANT
GLOVE SURG UNDER POLY LF SZ7 (GLOVE) ×2 IMPLANT
GLOVE SURG UNDER POLY LF SZ8 (GLOVE) ×2
GOWN STRL REUS W/TWL LRG LVL3 (GOWN DISPOSABLE) ×4 IMPLANT
K-WIRE 2.0 (WIRE) ×2
K-WIRE FX234X2XDRL TIP NS (WIRE) ×1
KIT BASIN OR (CUSTOM PROCEDURE TRAY) ×2 IMPLANT
KIT TURNOVER KIT A (KITS) IMPLANT
KWIRE FX234X2XDRL TIP NS (WIRE) ×1 IMPLANT
MANIFOLD NEPTUNE II (INSTRUMENTS) ×2 IMPLANT
NAIL HUMERAL RIGHT (Nail) ×2 IMPLANT
NS IRRIG 1000ML POUR BTL (IV SOLUTION) ×2 IMPLANT
PACK SHOULDER (CUSTOM PROCEDURE TRAY) ×2 IMPLANT
PAD COLD SHLDR WRAP-ON (PAD) ×2 IMPLANT
PENCIL SMOKE EVACUATOR (MISCELLANEOUS) IMPLANT
PROTECTOR NERVE ULNAR (MISCELLANEOUS) ×2 IMPLANT
RESTRAINT HEAD UNIVERSAL NS (MISCELLANEOUS) ×2 IMPLANT
SCREW DISTAL 4.3X24MM (Screw) ×2 IMPLANT
SCREW DISTAL 5X32MM (Screw) ×2 IMPLANT
SCREW PROXIMAL 5X36MM (Screw) ×2 IMPLANT
SLING ARM IMMOBILIZER LRG (SOFTGOODS) ×2 IMPLANT
SPONGE GAUZE 2X2 STER 10/PKG (GAUZE/BANDAGES/DRESSINGS)
STRIP CLOSURE SKIN 1/2X4 (GAUZE/BANDAGES/DRESSINGS) ×2 IMPLANT
SUPPORT WRAP ARM LG (MISCELLANEOUS) ×2 IMPLANT
SUT MNCRL AB 4-0 PS2 18 (SUTURE) ×2 IMPLANT
SUT VIC AB 2-0 CT1 27 (SUTURE) ×2
SUT VIC AB 2-0 CT1 TAPERPNT 27 (SUTURE) ×1 IMPLANT
SUT VIC AB 4-0 PS2 27 (SUTURE) IMPLANT
TOWEL OR 17X26 10 PK STRL BLUE (TOWEL DISPOSABLE) ×2 IMPLANT
WATER STERILE IRR 1000ML POUR (IV SOLUTION) ×2 IMPLANT
WIRE GUIDE MODEL 22X500MM (WIRE) ×2 IMPLANT

## 2020-12-18 NOTE — Anesthesia Procedure Notes (Addendum)
Procedure Name: Intubation Date/Time: 12/18/2020 1:23 PM Performed by: Raenette Rover, CRNA Pre-anesthesia Checklist: Patient identified, Emergency Drugs available, Suction available and Patient being monitored Patient Re-evaluated:Patient Re-evaluated prior to induction Oxygen Delivery Method: Circle system utilized Preoxygenation: Pre-oxygenation with 100% oxygen Induction Type: IV induction Ventilation: Mask ventilation without difficulty Laryngoscope Size: Mac and 3 Grade View: Grade I Tube type: Oral Tube size: 7.5 mm Number of attempts: 2 Airway Equipment and Method: Stylet and Oral airway Placement Confirmation: ETT inserted through vocal cords under direct vision,  positive ETCO2 and breath sounds checked- equal and bilateral Secured at: 22 cm Tube secured with: Tape Dental Injury: Teeth and Oropharynx as per pre-operative assessment  Comments: Grade I view with a MAC; tube migrated after repositioning, 2nd DL for reinsertion of ET Tube; intubation performed by SRNA and reinserted by MDA

## 2020-12-18 NOTE — Transfer of Care (Signed)
Immediate Anesthesia Transfer of Care Note  Patient: Erika Luna  Procedure(s) Performed: OPEN REDUCTION INTERNAL FIXATION (ORIF) PROXIMAL HUMERUS FRACTURE (Right )  Patient Location: PACU  Anesthesia Type:GA combined with regional for post-op pain  Level of Consciousness: awake, alert , oriented and patient cooperative  Airway & Oxygen Therapy: Patient Spontanous Breathing and Patient connected to face mask oxygen  Post-op Assessment: Report given to RN and Post -op Vital signs reviewed and stable  Post vital signs: Reviewed and stable  Last Vitals:  Vitals Value Taken Time  BP 135/88 12/18/20 1440  Temp    Pulse 95 12/18/20 1442  Resp 17 12/18/20 1442  SpO2 100 % 12/18/20 1442  Vitals shown include unvalidated device data.  Last Pain:  Vitals:   12/18/20 1139  TempSrc:   PainSc: 0-No pain      Patients Stated Pain Goal: 5 (51/89/84 2103)  Complications: No complications documented.

## 2020-12-18 NOTE — Anesthesia Procedure Notes (Signed)
Anesthesia Regional Block: Interscalene brachial plexus block   Pre-Anesthetic Checklist: ,, timeout performed, Correct Patient, Correct Site, Correct Laterality, Correct Procedure, Correct Position, site marked, Risks and benefits discussed, pre-op evaluation,  At surgeon's request and post-op pain management  Laterality: Right  Prep: Maximum Sterile Barrier Precautions used, chloraprep       Needles:  Injection technique: Single-shot  Needle Type: Echogenic Stimulator Needle     Needle Length: 4cm  Needle Gauge: 22     Additional Needles:   Procedures:,,,, ultrasound used (permanent image in chart),,,,  Narrative:  Start time: 12/18/2020 11:27 AM End time: 12/18/2020 11:30 AM Injection made incrementally with aspirations every 5 mL.  Performed by: Personally  Anesthesiologist: Brennan Bailey, MD  Additional Notes: Risks, benefits, and alternative discussed. Patient gave consent for procedure. Patient prepped and draped in sterile fashion. Sedation administered, patient remains easily responsive to voice. Relevant anatomy identified with ultrasound guidance. Local anesthetic given in 5cc increments with no signs or symptoms of intravascular injection. No pain or paraesthesias with injection. Patient monitored throughout procedure with signs of LAST or immediate complications. Tolerated well. Ultrasound image placed in chart.  Tawny Asal, MD

## 2020-12-18 NOTE — H&P (Signed)
Erika Luna is an 70 y.o. female.   Chief Complaint: R shoulder injury  HPI: s/p fall with R displaced 2 part proximal humerus fracture.  Past Medical History:  Diagnosis Date  . Anxiety   . Depression   . GERD (gastroesophageal reflux disease)   . Hypercholesteremia   . Hypertension     Past Surgical History:  Procedure Laterality Date  . CESAREAN SECTION     x 2  . surgery to remove scar tissue from endometriosis    . TONSILLECTOMY      Family History  Problem Relation Age of Onset  . Hyperlipidemia Brother   . Coronary artery disease Brother 19       CABG 2013  . Peripheral vascular disease Brother   . Hypertension Mother   . Hypertension Father   . Hypertension Brother    Social History:  reports that she has never smoked. She has never used smokeless tobacco. She reports that she does not drink alcohol and does not use drugs.  Allergies:  Allergies  Allergen Reactions  . Azithromycin Nausea And Vomiting  . Codeine Nausea And Vomiting  . Lisinopril Rash    Medications Prior to Admission  Medication Sig Dispense Refill  . acetaminophen (TYLENOL) 500 MG tablet Take 1,000 mg by mouth every 8 (eight) hours as needed for moderate pain.    Marland Kitchen amLODipine (NORVASC) 10 MG tablet Take 10 mg by mouth daily.    Marland Kitchen aspirin 81 MG chewable tablet Chew 81 mg by mouth daily.    Marland Kitchen buPROPion (WELLBUTRIN XL) 300 MG 24 hr tablet Take 300 mg by mouth daily.    . cholecalciferol (VITAMIN D) 1000 UNITS tablet Take 1,000 Units by mouth daily.    . fish oil-omega-3 fatty acids 1000 MG capsule Take 1 g by mouth daily.    Marland Kitchen ibuprofen (ADVIL) 200 MG tablet Take 600 mg by mouth every 8 (eight) hours as needed for moderate pain.    Marland Kitchen irbesartan (AVAPRO) 75 MG tablet Take 75 mg by mouth daily.    . metoprolol tartrate (LOPRESSOR) 25 MG tablet Take 12.5 mg by mouth at bedtime.    . pantoprazole (PROTONIX) 40 MG tablet Take 40 mg by mouth daily.    . raloxifene (EVISTA) 60 MG tablet  Take 60 mg by mouth daily.    . Red Yeast Rice 600 MG CAPS Take 600 mg by mouth daily.    . traZODone (DESYREL) 100 MG tablet Take 100 mg by mouth at bedtime.    . Turmeric 500 MG CAPS Take 500 mg by mouth daily.    . vitamin B-12 (CYANOCOBALAMIN) 1000 MCG tablet Take 1,000 mcg by mouth daily.    . vitamin E 400 UNIT capsule Take 400 Units by mouth daily.      Results for orders placed or performed during the hospital encounter of 12/17/20 (from the past 48 hour(s))  SARS CORONAVIRUS 2 (TAT 6-24 HRS) Nasopharyngeal Nasopharyngeal Swab     Status: None   Collection Time: 12/17/20  9:16 AM   Specimen: Nasopharyngeal Swab  Result Value Ref Range   SARS Coronavirus 2 NEGATIVE NEGATIVE    Comment: (NOTE) SARS-CoV-2 target nucleic acids are NOT DETECTED.  The SARS-CoV-2 RNA is generally detectable in upper and lower respiratory specimens during the acute phase of infection. Negative results do not preclude SARS-CoV-2 infection, do not rule out co-infections with other pathogens, and should not be used as the sole basis for treatment or other patient management  decisions. Negative results must be combined with clinical observations, patient history, and epidemiological information. The expected result is Negative.  Fact Sheet for Patients: SugarRoll.be  Fact Sheet for Healthcare Providers: https://www.woods-mathews.com/  This test is not yet approved or cleared by the [place] FDA and  has been authorized for detection and/or diagnosis of SARS-CoV-2 by FDA under an Emergency Use Authorization (EUA). This EUA will remain  in effect (meaning this test can be used) for the duration of the COVID-19 declaration under Se ction 564(b)(1) of the Act, 21 U.S.C. section 360bbb-3(b)(1), unless the authorization is terminated or revoked sooner.  Performed at [facility], [place] [address], [place], [place] [address]    No results  found.  Review of Systems  All other systems reviewed and are negative.   Blood pressure 140/79, pulse 98, temperature 98.8 F (37.1 C), temperature source Oral, resp. rate 19, height 5' 2"  (1.575 m), weight 71.2 kg, SpO2 100 %. Physical Exam Constitutional:      Appearance: Normal appearance.  Eyes:     Extraocular Movements: Extraocular movements intact.  Cardiovascular:     Pulses: Normal pulses.  Pulmonary:     Effort: Pulmonary effort is normal.  Musculoskeletal:     Comments: RUE TTP and swelling about prox humerus. Distally NVID  Skin:    General: Skin is warm and dry.  Neurological:     Mental Status: She is alert.  Psychiatric:        Mood and Affect: Mood normal.      Assessment/Plan R displaced 2 part proximal humerus fracture. Plan IMN vs plate fixation  Risks / benefits of surgery discussed Consent on chart  NPO for OR Preop antibiotics   Isabella Stalling, MD 12/18/2020, 12:02 PM

## 2020-12-18 NOTE — Progress Notes (Signed)
Assisted Dr. Birdie Sons with right, ultrasound guided, interscalene  block. Side rails up, monitors on throughout procedure. See vital signs in flow sheet. Tolerated Procedure well.

## 2020-12-18 NOTE — Discharge Instructions (Signed)
Discharge Instructions after Open Shoulder Repair  A sling has been provided for you. Remain in your sling at all times. This includes sleeping in your sling.  Use ice on the shoulder intermittently over the first 48 hours after surgery.  Pain medicine has been prescribed for you.  Use your medicine liberally over the first 48 hours, and then you can begin to taper your use. You may take Extra Strength Tylenol or Tylenol only in place of the pain pills. DO NOT take ANY nonsteroidal anti-inflammatory pain medications: Advil, Motrin, Ibuprofen, Aleve, Naproxen or Naprosyn.  You may remove your dressing after two days. Keep the steri strips on until they peel or fall off You may shower 5 days after surgery. The incisions CANNOT get wet prior to 5 days. Simply allow the water to wash over the site and then pat dry. Do not rub the incisions. Make sure your axilla (armpit) is completely dry after showering.  Take one aspirin, a day for 2 weeks after surgery, unless you have an aspirin sensitivity/ allergy or asthma.   Please call [phone] during normal business hours or [phone] after hours for any problems. Including the following:  - excessive redness of the incisions - drainage for more than 4 days - fever of more than 101.5 F  *Please note that pain medications will not be refilled after hours or on weekends.

## 2020-12-18 NOTE — Anesthesia Postprocedure Evaluation (Signed)
Anesthesia Post Note  Patient: Erika Luna  Procedure(s) Performed: OPEN REDUCTION INTERNAL FIXATION (ORIF) PROXIMAL HUMERUS FRACTURE (Right )     Patient location during evaluation: PACU Anesthesia Type: General Level of consciousness: awake and alert and oriented Pain management: pain level controlled Vital Signs Assessment: post-procedure vital signs reviewed and stable Respiratory status: spontaneous breathing, nonlabored ventilation and respiratory function stable Cardiovascular status: blood pressure returned to baseline Postop Assessment: no apparent nausea or vomiting Anesthetic complications: no   No complications documented.  Last Vitals:  Vitals:   12/18/20 1530 12/18/20 1545  BP: (!) 128/91 138/84  Pulse: 88 90  Resp: 17 18  Temp: 36.5 C 36.4 C  SpO2: 93% 94%    Last Pain:  Vitals:   12/18/20 1545  TempSrc:   PainSc: 0-No pain                 Brennan Bailey

## 2020-12-18 NOTE — Op Note (Signed)
Procedure(s): OPEN REDUCTION INTERNAL FIXATION (ORIF) PROXIMAL HUMERUS FRACTURE Procedure Note  CONCEPTION DOEBLER female 70 y.o. 12/18/2020   Preoperative diagnosis: Right displaced 2 part proximal humerus fracture  Postoperative diagnosis: Same  Procedure(s) and Anesthesia Type:    * OPEN REDUCTION INTERNAL FIXATION (ORIF) PROXIMAL HUMERUS FRACTURE - General  Surgeon(s) and Role:    Tania Ade, MD - Primary   Indications:  70 y.o. female s/p fall with right displaced 2 part proximal humerus fracture with unacceptable alignment      Surgeon: Isabella Stalling   Assistants: Jeanmarie Hubert PA-C (Danielle was present and scrubbed throughout the procedure and was essential in positioning, retraction, exposure, and closure)  Anesthesia: General endotracheal anesthesia with preoperative interscalene block given by the attending anesthesiologist     Procedure Detail  OPEN REDUCTION INTERNAL FIXATION (ORIF) PROXIMAL HUMERUS FRACTURE  Findings: Near anatomic reduction with a Tornier proximal humerus nail  Estimated Blood Loss:  less than 50 mL         Drains: none  Blood Given: none         Specimens: none        Complications:  * No complications entered in OR log *         Disposition: PACU - hemodynamically stable.         Condition: stable    Procedure:    DESCRIPTION OF PROCEDURE: The patient was identified in preoperative  holding area where I personally marked the operative site after  verifying site, side, and procedure with the patient. The patient was taken back  to the operating room where general anesthesia was induced without  complication and was placed in the beach-chair position with the back  elevated about 40 degrees and all extremities carefully padded and  positioned.  The right upper extremity was then prepped and  draped in a standard sterile fashion. The appropriate time-out  procedure was carried out. The patient did  receive IV antibiotics  within 30 minutes of incision.  An incision was made in [employer] centered over the anterolateral acromion.  The incision was approximately 3 to 4 cm.  Dissection was carried down through subcutaneous tissues to the deltoid fascia.  The deltoid fascia was split longitudinally in line with the anterior acromion.  Approximately 2 to 3 cm of the anterior deltoid insertion was taken off the anterior acromion to allow for exposure and then extending down about 2 to 3 cm laterally.  The underlying rotator cuff was exposed and noted to be intact.  At this point given the varus angulation of the proximal fracture segment a small K wire was placed laterally and used to apply valgus force bringing the fragment into an anatomic position.  Using the entry awl the starting point on the superior central aspect of the humeral head was identified with orthogonal fluoroscopy.  This was inserted to a depth for the 9 mm nail.  At this point the guide wire was advanced past the fracture site and the nail was then advanced over the guidewire.  The proximal jig was used to place 2 percutaneous interlocking screws in the greater tuberosity.  Careful spreading dissection was used to avoid any injury to the axillary nerve.  At this point attention was turned to the distal interlocking screw and using the jig in a similar fashion a small percutaneous incision was made spreading down to the bone to prevent any nerve injury.  The appropriate size interlock was placed in the static  position. The proximal jig was removed and final fluoroscopic imaging demonstrated near anatomic alignment with nail and screws to be in good position.  At this point copious irrigation was used and the deltoid was repaired back to the acromion and to itself laterally with 0 PDS suture.  Skin was then closed in layers with 2-0 Vicryl and 4-0 Monocryl as well as Steri-Strips.  A light sterile dressing was applied.  The patient was  allowed to awaken from anesthesia placed in a sling transferred to the stretcher and taken to the recovery room in stable condition.  Postoperative plan: She will be observed in the recovery room and as long as she has good pain control with her regional block I think she can go home today with family.  She will follow up in my office in about 2 weeks.

## 2020-12-19 ENCOUNTER — Encounter (HOSPITAL_COMMUNITY): Payer: Self-pay | Admitting: Orthopedic Surgery

## 2021-07-06 LAB — HM DEXA SCAN

## 2021-11-15 HISTORY — PX: UPPER GI ENDOSCOPY: SHX6162

## 2022-01-08 ENCOUNTER — Other Ambulatory Visit: Payer: Self-pay | Admitting: Orthopaedic Surgery

## 2022-01-08 DIAGNOSIS — M25572 Pain in left ankle and joints of left foot: Secondary | ICD-10-CM

## 2022-01-26 ENCOUNTER — Other Ambulatory Visit: Payer: Self-pay

## 2022-01-26 ENCOUNTER — Ambulatory Visit
Admission: RE | Admit: 2022-01-26 | Discharge: 2022-01-26 | Disposition: A | Discharge status: Home / Self Care | Payer: Medicare Other | Source: Ambulatory Visit | Attending: Orthopaedic Surgery | Admitting: Orthopaedic Surgery

## 2022-01-26 DIAGNOSIS — M25572 Pain in left ankle and joints of left foot: Secondary | ICD-10-CM

## 2022-02-08 ENCOUNTER — Telehealth: Payer: Self-pay | Admitting: Gastroenterology

## 2022-02-08 NOTE — Telephone Encounter (Signed)
Patient called with a transfer of care request to Dr. Fuller Plan had colon\EGD done in 2018 requested records for review. ?

## 2022-02-10 ENCOUNTER — Encounter: Payer: Self-pay | Admitting: Gastroenterology

## 2022-02-25 ENCOUNTER — Encounter: Payer: Self-pay | Admitting: Gastroenterology

## 2022-02-25 ENCOUNTER — Ambulatory Visit (INDEPENDENT_AMBULATORY_CARE_PROVIDER_SITE_OTHER): Payer: Medicare Other | Admitting: Gastroenterology

## 2022-02-25 VITALS — BP 110/68 | HR 80 | Ht 62.0 in | Wt 158.2 lb

## 2022-02-25 DIAGNOSIS — K219 Gastro-esophageal reflux disease without esophagitis: Secondary | ICD-10-CM | POA: Diagnosis not present

## 2022-02-25 DIAGNOSIS — K5909 Other constipation: Secondary | ICD-10-CM

## 2022-02-25 MED ORDER — ESOMEPRAZOLE MAGNESIUM 40 MG PO CPDR: 40.0000 mg | DELAYED_RELEASE_CAPSULE | Freq: Two times a day (BID) | ORAL | 3 refills | Status: DC

## 2022-02-25 MED ORDER — FAMOTIDINE 20 MG PO TABS: 20.0000 mg | ORAL_TABLET | Freq: Every day | ORAL | 3 refills | Status: DC

## 2022-02-25 NOTE — Progress Notes (Addendum)
03/04/2022 Erika Luna [id number] 12/13/1950   HISTORY OF PRESENT ILLNESS: This is a 71 year old female who is new to our office.  She has been referred here by Dr. Rosalie Doctor evaluation regarding GERD and constipation.  She tells me that she has always had issues with her GI tract.  She says that recently she gets frequent nausea and "feels gaggy".  Says that 3 weeks ago she had right upper quadrant abdominal pain that was an aching type pain and went into her back.  She is on pantoprazole 40 mg twice daily for reflux.  She says she used to take Aciphex, but insurance would no longer pay for it or something and she does not feel like the pantoprazole works as well.  She also reports chronic issues with constipation.  Currently is only using Colace daily and Dulcolax as needed.  In her history it says "ulcerative colitis".  She says that this was when she was around 71 years old.  Sounds like it was probably more an episode of infectious colitis or something.  She has had several colonoscopies since then with no recurrent issues or findings.  Most recent labs in December 2022 at which time CBC, CMP, and TSH were normal.  She had both EGD and colonoscopy performed in February 2018 by Dr. Shary Key at which time EGD revealed fundic gland polyps, but esophagus and duodenum were normal.  Colonoscopy was normal without polyps, diverticuli, AVMs, informatory changes, masses, or other mucosal abnormalities.  Repeat was recommended at 10-year interval.  Per note from Dr. Shary Key in 2017 it looks like he felt like a lot of her GI symptoms were functional and had talked about possibly trying a tricyclic antidepressant at one point.  Past Medical History:  Diagnosis Date   Anemia    Anxiety    Arthritis    Chronic headaches    Colon polyps    Depression    GERD (gastroesophageal reflux disease)    Hypercholesteremia    Hypertension    UC (ulcerative colitis) ([place])    Past Surgical  History:  Procedure Laterality Date   ABDOMINAL HYSTERECTOMY     APPENDECTOMY  1981   CESAREAN SECTION     x 2   ORIF HUMERUS FRACTURE Right 12/18/2020   Procedure: OPEN REDUCTION INTERNAL FIXATION (ORIF) PROXIMAL HUMERUS FRACTURE;  Surgeon: Tania Ade, MD;  Location: [facility];  Service: Orthopedics;  Laterality: Right;   surgery to remove scar tissue from endometriosis     TONSILLECTOMY      reports that she has never smoked. She has never used smokeless tobacco. She reports that she does not drink alcohol and does not use drugs. family history includes Breast cancer in her maternal aunt and maternal aunt; CAD in her brother; Depression in her daughter; Heart disease in her father; Hepatitis in her maternal aunt; Hyperlipidemia in her brother; Hypertension in her brother, father, and mother; Peripheral vascular disease in her brother; Prostate cancer in her maternal grandfather. Allergies  Allergen Reactions   Azithromycin Nausea And Vomiting   Codeine Nausea And Vomiting   Lisinopril Rash      Outpatient Encounter Medications as of 02/25/2022  Medication Sig   amLODipine (NORVASC) 10 MG tablet Take 10 mg by mouth daily.   aspirin 81 MG chewable tablet Chew 81 mg by mouth daily.   buPROPion (WELLBUTRIN XL) 300 MG 24 hr tablet Take 300 mg by mouth daily.   cholecalciferol (VITAMIN D) 1000 UNITS tablet Take  1,000 Units by mouth daily.   EC-NAPROXEN 500 MG EC tablet Take 500 mg by mouth as needed.   esomeprazole (NEXIUM) 40 MG capsule Take 1 capsule (40 mg total) by mouth 2 (two) times daily before a meal.   famotidine (PEPCID) 20 MG tablet Take 1 tablet (20 mg total) by mouth at bedtime.   irbesartan (AVAPRO) 75 MG tablet Take 75 mg by mouth daily.   metoprolol tartrate (LOPRESSOR) 25 MG tablet Take 12.5 mg by mouth at bedtime.   raloxifene (EVISTA) 60 MG tablet Take 60 mg by mouth daily.   Red Yeast Rice 600 MG CAPS Take 600 mg by mouth daily.   traZODone (DESYREL) 100 MG tablet  Take 100 mg by mouth at bedtime.   [DISCONTINUED] pantoprazole (PROTONIX) 40 MG tablet Take 40 mg by mouth daily.   [DISCONTINUED] fish oil-omega-3 fatty acids 1000 MG capsule Take 1 g by mouth daily.   [DISCONTINUED] oxyCODONE-acetaminophen (PERCOCET) 5-325 MG tablet Take 1-2 tablets every 4 hours as needed for post operative pain. MAX 6/day   [DISCONTINUED] tiZANidine (ZANAFLEX) 4 MG tablet Take 1 tablet (4 mg total) by mouth every 8 (eight) hours as needed for muscle spasms.   [DISCONTINUED] Turmeric 500 MG CAPS Take 500 mg by mouth daily.   [DISCONTINUED] vitamin B-12 (CYANOCOBALAMIN) 1000 MCG tablet Take 1,000 mcg by mouth daily.   [DISCONTINUED] vitamin E 400 UNIT capsule Take 400 Units by mouth daily.   No facility-administered encounter medications on file as of 02/25/2022.    REVIEW OF SYSTEMS  : All other systems reviewed and negative except where noted in the History of Present Illness.   PHYSICAL EXAM: BP 110/68 (BP Location: Left Arm, Patient Position: Sitting, Cuff Size: Normal)   Pulse 80   Ht 5' 2"  (1.575 m) Comment: measured without shoes  Wt 158 lb 4 oz (71.8 kg)   BMI 28.94 kg/m  General: Well developed white female in no acute distress Head: Normocephalic and atraumatic Eyes:  Sclerae anicteric, conjunctiva pink. Ears: Normal auditory Lungs: Clear throughout to auscultation; no W/R/R. Heart: Regular rate and rhythm; no M/R/G. Abdomen: Soft, non-distended.  BS present.  Non-tender, Musculoskeletal: Symmetrical with no gross deformities  Skin: No lesions on visible extremities Extremities: No edema  Neurological: Alert oriented x 4, grossly non-focal Psychological:  Alert and cooperative. Normal mood and affect  ASSESSMENT AND PLAN: *GERD: Has had some nausea as well and had an episode of right upper quadrant abdominal pain radiating to her back 3 weeks ago.  Is currently on pantoprazole 40 mg BID.  She does not really feel like that is helping.  Working to max  out her right antireflux regimen for now.  We will change her PPI to Nexium 40 mg twice daily, 30 to 60 minutes before breakfast and 30 to 60 minutes before dinner.  We will also add Pepcid 20 mg at bedtime.  If she improves with this regimen then can slowly decrease and taper down over time.  Prescription sent to her pharmacy.  Consider right upper quadrant abdominal ultrasound or some other type of abdominal imaging if symptoms do not improve with change in regimen, but she declined and chose to observe and see how she does for now for now. *Constipation: Currently only using stool softeners and occasional Dulcolax.  I have encouraged her to begin using a capful of MiraLAX mixed in 8 ounces of liquid daily.  **She will call or message [place] back with an update in about 4 weeks.  **  Addendum: Received EGD report from February 2018 that showed polyps in the stomach and biopsies confirmed fundic gland polyps.  Colonoscopy at the same time was normal with a repeat recommended in 10 years.  That was with Dr. Shary Key.   CC:  Jettie Booze, NP CC:  Dr. Erlene Quan

## 2022-02-25 NOTE — Patient Instructions (Signed)
Stop Pantoprazole. ? ?We have sent the following medications to your pharmacy for you to pick up at your convenience: ?Nexium 40 mg twice daily 30-60 minutes before breakfast and dinner. ?Pepcid 20 mg nightly before bedtime.  ? ?Start Miralax 1 capful daily in 8 ounces of liquid. ? ?If you are age 70 or older, your body mass index should be between 23-30. Your Body mass index is 28.9 kg/m?Marland Kitchen If this is out of the aforementioned range listed, please consider follow up with your Primary Care Provider. ?________________________________________________________ ? ?The [facility] providers would like to encourage you to use [facility] to communicate with providers for non-urgent requests or questions.  Due to long hold times on the telephone, sending your provider a message by [facility] may be a faster and more efficient way to get a response.  Please allow 48 business hours for a response.  Please remember that this is for non-urgent requests.  ?_______________________________________________________ ?

## 2022-03-04 ENCOUNTER — Encounter: Payer: Self-pay | Admitting: Gastroenterology

## 2022-03-04 DIAGNOSIS — K5909 Other constipation: Secondary | ICD-10-CM | POA: Insufficient documentation

## 2022-03-04 DIAGNOSIS — K219 Gastro-esophageal reflux disease without esophagitis: Secondary | ICD-10-CM | POA: Insufficient documentation

## 2022-03-04 NOTE — Progress Notes (Signed)
Thank you for the note that this patient has since requested care by Dr. Fuller Plan since her husband also sees him. ?Dr. Fuller Plan accepted her care management. ? ?- HD ?

## 2022-03-30 ENCOUNTER — Telehealth: Payer: Self-pay | Admitting: Gastroenterology

## 2022-03-30 NOTE — Telephone Encounter (Signed)
Patient called states she is not well having upper GI issues also stated her tongue is swollen seeking advise. ?

## 2022-03-30 NOTE — Telephone Encounter (Signed)
Left message for pt to call back  °

## 2022-03-31 NOTE — Telephone Encounter (Signed)
Spoke with patient & she says went to a walk in clinic yesterday (she could not recall the name of it), and was given medication for thrush. She says it seems to be helping & the swelling is resolving. She has been advised to keep follow up with Janett Billow, PA next week 04/08/22.  ?

## 2022-04-08 ENCOUNTER — Ambulatory Visit (INDEPENDENT_AMBULATORY_CARE_PROVIDER_SITE_OTHER): Payer: Medicare Other | Admitting: Gastroenterology

## 2022-04-08 ENCOUNTER — Encounter: Payer: Self-pay | Admitting: Gastroenterology

## 2022-04-08 VITALS — BP 126/82 | HR 100 | Ht 62.0 in | Wt 155.1 lb

## 2022-04-08 DIAGNOSIS — R1011 Right upper quadrant pain: Secondary | ICD-10-CM | POA: Insufficient documentation

## 2022-04-08 DIAGNOSIS — R1013 Epigastric pain: Secondary | ICD-10-CM | POA: Insufficient documentation

## 2022-04-08 MED ORDER — FAMOTIDINE 20 MG PO TABS: 20.0000 mg | ORAL_TABLET | Freq: Every day | ORAL | 3 refills | Status: DC

## 2022-04-08 MED ORDER — ESOMEPRAZOLE MAGNESIUM 40 MG PO CPDR: 40.0000 mg | DELAYED_RELEASE_CAPSULE | Freq: Every day | ORAL | 3 refills | Status: DC

## 2022-04-08 NOTE — Patient Instructions (Signed)
You have been scheduled for an abdominal ultrasound at [facility] (1st floor of hospital) on 04/16/2022 at 8 am. Please arrive 15 minutes prior to your appointment for registration. Make certain not to have anything to eat or drink 8  hours prior to your appointment. Should you need to reschedule your appointment, please contact radiology at [phone]. This test typically takes about 30 minutes to perform.   We have sent the following medications to your pharmacy for you to pick up at your convenience:  Nexium and Pepcid  If you are age 28 or older, your body mass index should be between 23-30. Your Body mass index is 28.37 kg/m. If this is out of the aforementioned range listed, please consider follow up with your Primary Care Provider.  If you are age 16 or younger, your body mass index should be between 19-25. Your Body mass index is 28.37 kg/m. If this is out of the aformentioned range listed, please consider follow up with your Primary Care Provider.   ________________________________________________________  The [facility] providers would like to encourage you to use [facility] to communicate with providers for non-urgent requests or questions.  Due to long hold times on the telephone, sending your provider a message by [facility] may be a faster and more efficient way to get a response.  Please allow 48 business hours for a response.  Please remember that this is for non-urgent requests.  _______________________________________________________   Due to recent changes in healthcare laws, you may see the results of your imaging and laboratory studies on MyChart before your provider has had a chance to review them.  We understand that in some cases there may be results that are confusing or concerning to you. Not all laboratory results come back in the same time frame and the provider may be waiting for multiple results in order to interpret others.  Please give [place] 48 hours in order for your  provider to thoroughly review all the results before contacting the office for clarification of your results.    I appreciate the  opportunity to care for you  Thank You   Janett Billow Zehr,PA-C

## 2022-04-08 NOTE — Progress Notes (Signed)
04/08/2022 Erika Luna [id number] 08/23/1951   HISTORY OF PRESENT ILLNESS: This is a 71 year old female who was seen by me on 02/25/2022.  Please see that note for further details leading up to that point.  Her care was assigned to Dr. Fuller Plan at that time.  She was seeing me then for complaints of constipation what sounded like acid reflux with nausea and epigastric/RUQ abdominal discomfort.  I encouraged her to begin using MiraLAX daily and we changed her pantoprazole 40 mg twice daily to Nexium 40 mg twice daily and added Pepcid at bedtime.  She is here for follow-up.  She tells me that she did not like the way the MiraLAX made her feel.  She is now just doing her stool softeners and her kombucha and seems to be doing well with that in regards to the constipation.  She has not noticed any change in her reflux/nausea/upper abdominal pain with the Nexium twice daily and Pepcid at bedtime.  She also says the Nexium is expensive.  She continues with epigastric and right upper quadrant abdominal pain that radiates to her back at times.  She reports that she was having what she thought was thrush in her mouth and some sores in her mouth.  She was seen in urgent care in [facility] and they gave her nystatin mouthwash and told her to swallow it.  She says that since using that her mouth and stomach issues seem to be a little bit better.  She talked to her dentist about her mouth issues before and never really got any solutions.  She had both EGD and colonoscopy performed in February 2018 by Dr. Shary Key at which time EGD revealed fundic gland polyps, but esophagus and duodenum were normal.  Colonoscopy was normal without polyps, diverticuli, AVMs, informatory changes, masses, or other mucosal abnormalities.  Repeat was recommended at 10-year interval.   Per note from Dr. Shary Key in 2017 it looks like he felt like a lot of her GI symptoms were functional and had talked about possibly trying a  tricyclic antidepressant at one point.   Past Medical History:  Diagnosis Date   Anemia    Anxiety    Arthritis    Chronic headaches    Colon polyps    Depression    GERD (gastroesophageal reflux disease)    Hypercholesteremia    Hypertension    UC (ulcerative colitis) ([place])    Past Surgical History:  Procedure Laterality Date   ABDOMINAL HYSTERECTOMY     APPENDECTOMY  1981   CESAREAN SECTION     x 2   ORIF HUMERUS FRACTURE Right 12/18/2020   Procedure: OPEN REDUCTION INTERNAL FIXATION (ORIF) PROXIMAL HUMERUS FRACTURE;  Surgeon: Tania Ade, MD;  Location: [facility];  Service: Orthopedics;  Laterality: Right;   surgery to remove scar tissue from endometriosis     TONSILLECTOMY      reports that she has never smoked. She has never used smokeless tobacco. She reports that she does not drink alcohol and does not use drugs. family history includes Breast cancer in her maternal aunt and maternal aunt; CAD in her brother; Depression in her daughter; Heart disease in her father; Hepatitis in her maternal aunt; Hyperlipidemia in her brother; Hypertension in her brother, father, and mother; Peripheral vascular disease in her brother; Prostate cancer in her maternal grandfather. Allergies  Allergen Reactions   Azithromycin Nausea And Vomiting   Codeine Nausea And Vomiting   Lisinopril Rash  Outpatient Encounter Medications as of 04/08/2022  Medication Sig   amLODipine (NORVASC) 10 MG tablet Take 10 mg by mouth daily.   aspirin 81 MG chewable tablet Chew 81 mg by mouth daily.   buPROPion (WELLBUTRIN XL) 300 MG 24 hr tablet Take 300 mg by mouth daily.   cholecalciferol (VITAMIN D) 1000 UNITS tablet Take 1,000 Units by mouth daily.   EC-NAPROXEN 500 MG EC tablet Take 500 mg by mouth as needed.   esomeprazole (NEXIUM) 40 MG capsule Take 1 capsule (40 mg total) by mouth 2 (two) times daily before a meal.   famotidine (PEPCID) 20 MG tablet Take 1 tablet (20 mg total) by mouth at  bedtime.   irbesartan (AVAPRO) 75 MG tablet Take 75 mg by mouth daily.   metoprolol tartrate (LOPRESSOR) 25 MG tablet Take 12.5 mg by mouth at bedtime.   nystatin (MYCOSTATIN) 100000 UNIT/ML suspension Take 10 mLs by mouth 4 (four) times daily.   raloxifene (EVISTA) 60 MG tablet Take 60 mg by mouth daily.   Red Yeast Rice 600 MG CAPS Take 600 mg by mouth daily.   traZODone (DESYREL) 100 MG tablet Take 100 mg by mouth at bedtime.   No facility-administered encounter medications on file as of 04/08/2022.     REVIEW OF SYSTEMS  : All other systems reviewed and negative except where noted in the History of Present Illness.   PHYSICAL EXAM: BP 126/82   Pulse 100   Ht 5' 2"  (1.575 m)   Wt 155 lb 2 oz (70.4 kg)   BMI 28.37 kg/m  General: Well developed white female in no acute distress Head: Normocephalic and atraumatic Eyes:  Sclerae anicteric, conjunctiva pink. Ears: Normal auditory acuity Mouth:  No definite thrush noted.   Lungs: Clear throughout to auscultation; no W/R/R. Heart: Regular rate and rhythm; no M/R/G. Abdomen: Soft, non-distended.  BS present.  Mild epigastric and RUQ TTP. Musculoskeletal: Symmetrical with no gross deformities  Skin: No lesions on visible extremities Extremities: No edema  Neurological: Alert oriented x 4, grossly non-focal Psychological:  Alert and cooperative. Normal mood and affect  ASSESSMENT AND PLAN: *Right upper quadrant and epigastric abdominal pain with nausea:  EGD in February 2018 showed only fundic gland polyps.  Pain does radiate into the right side of her back at times.  We will start with right upper quadrant abdominal ultrasound.  May need repeat EGD.  Nexium twice daily does not seem to be making any additional difference.  We will back down to once daily and continue the Pepcid at bedtime.  We will send new prescriptions to her mail away pharmacy for the Nexium and the Pepcid. *Constipation: She did not like how the MiraLAX made her  feel.  She is now back to doing her stool softeners and kombucha seems to be doing well with that.  We will continue for now. *Complaints of recurrent thrush, sores on her tongue, etc.  She tried discussing it with her dentist in the past without any solutions.  She has an ENT that she has followed with for other issues in the past.  She will consider seeing them for these issues.   CC:  Jettie Booze, NP

## 2022-04-16 ENCOUNTER — Ambulatory Visit (HOSPITAL_COMMUNITY)
Admission: RE | Admit: 2022-04-16 | Discharge: 2022-04-16 | Disposition: A | Discharge status: Home / Self Care | Payer: Medicare Other | Source: Ambulatory Visit | Attending: Gastroenterology | Admitting: Gastroenterology

## 2022-04-16 DIAGNOSIS — R1013 Epigastric pain: Secondary | ICD-10-CM | POA: Insufficient documentation

## 2022-04-16 DIAGNOSIS — R1011 Right upper quadrant pain: Secondary | ICD-10-CM | POA: Diagnosis not present

## 2022-04-20 ENCOUNTER — Other Ambulatory Visit: Payer: Self-pay

## 2022-04-20 DIAGNOSIS — R1011 Right upper quadrant pain: Secondary | ICD-10-CM

## 2022-05-11 ENCOUNTER — Other Ambulatory Visit (HOSPITAL_COMMUNITY): Payer: Medicare Other

## 2022-05-13 ENCOUNTER — Encounter (HOSPITAL_COMMUNITY)
Admission: RE | Admit: 2022-05-13 | Discharge: 2022-05-13 | Disposition: A | Discharge status: Home / Self Care | Payer: Medicare Other | Source: Ambulatory Visit | Attending: Gastroenterology | Admitting: Gastroenterology

## 2022-05-13 DIAGNOSIS — R1011 Right upper quadrant pain: Secondary | ICD-10-CM | POA: Insufficient documentation

## 2022-06-08 ENCOUNTER — Encounter: Payer: Self-pay | Admitting: Gastroenterology

## 2022-06-08 ENCOUNTER — Other Ambulatory Visit (INDEPENDENT_AMBULATORY_CARE_PROVIDER_SITE_OTHER): Payer: Medicare Other

## 2022-06-08 ENCOUNTER — Ambulatory Visit (INDEPENDENT_AMBULATORY_CARE_PROVIDER_SITE_OTHER): Payer: Medicare Other | Admitting: Gastroenterology

## 2022-06-08 VITALS — BP 122/78 | HR 90 | Ht 62.0 in | Wt 152.6 lb

## 2022-06-08 DIAGNOSIS — R110 Nausea: Secondary | ICD-10-CM | POA: Diagnosis not present

## 2022-06-08 DIAGNOSIS — K5901 Slow transit constipation: Secondary | ICD-10-CM

## 2022-06-08 DIAGNOSIS — R1013 Epigastric pain: Secondary | ICD-10-CM | POA: Diagnosis not present

## 2022-06-08 DIAGNOSIS — R1011 Right upper quadrant pain: Secondary | ICD-10-CM

## 2022-06-08 DIAGNOSIS — K5900 Constipation, unspecified: Secondary | ICD-10-CM | POA: Diagnosis not present

## 2022-06-08 DIAGNOSIS — K219 Gastro-esophageal reflux disease without esophagitis: Secondary | ICD-10-CM

## 2022-06-08 LAB — CREATININE, SERUM: Creatinine, Ser: 1.01 mg/dL (ref 0.40–1.20)

## 2022-06-08 LAB — BUN: BUN: 17 mg/dL (ref 6–23)

## 2022-06-08 NOTE — Patient Instructions (Addendum)
If you are age 70 or older, your body mass index should be between 23-30. Your Body mass index is 27.91 kg/m. If this is out of the aforementioned range listed, please consider follow up with your Primary Care Provider.  If you are age 5 or younger, your body mass index should be between 19-25. Your Body mass index is 27.91 kg/m. If this is out of the aformentioned range listed, please consider follow up with your Primary Care Provider.   ________________________________________________________  The [place] [facility] providers would like to encourage you to use [facility] to communicate with providers for non-urgent requests or questions.  Due to long hold times on the telephone, sending your provider a message by [facility] may be a faster and more efficient way to get a response.  Please allow 48 business hours for a response.  Please remember that this is for non-urgent requests.  _______________________________________________________  Erika Luna have been scheduled for an endoscopy. Please follow written instructions given to you at your visit today. If you use inhalers (even only as needed), please bring them with you on the day of your procedure.  You have been scheduled for a CT scan of the abdomen and pelvis at [facility], 1st floor Radiology. You are scheduled on 06-18-2022 at 11:30am. You should arrive 15 minutes prior to your appointment time for registration.  We are giving you 2 bottles of contrast today that you will need to drink before arriving for the exam. The solution may taste better if refrigerated so put them in the refrigerator when you get home, but do NOT add ice or any other liquid to this solution as that would dilute it. Shake well before drinking.   Please follow the written instructions below on the day of your exam:   1) Do not eat anything after 7:30am (4 hours prior to your test)   2) Drink 1 bottle of contrast @ 930am (2 hours prior to your exam)  Remember to shake well  before drinking and do NOT pour over ice.     Drink 1 bottle of contrast @ 1030am (1 hour prior to your exam)   You may take any medications as prescribed with a small amount of water, if necessary. If you take any of the following medications: METFORMIN, GLUCOPHAGE, GLUCOVANCE, AVANDAMET, RIOMET, FORTAMET, [place] MET, JANUMET, GLUMETZA or METAGLIP, you MAY be asked to HOLD this medication 48 hours AFTER the exam.   The purpose of you drinking the oral contrast is to aid in the visualization of your intestinal tract. The contrast solution may cause some diarrhea. Depending on your individual set of symptoms, you may also receive an intravenous injection of x-ray contrast/dye. Plan on being at [facility] for 45 minutes or longer, depending on the type of exam you are having performed.   If you have any questions regarding your exam or if you need to reschedule, you may call [facility] at [phone] between the hours of 8:00 am and 5:00 pm, Monday-Friday.   Due to recent changes in healthcare laws, you may see the results of your imaging and laboratory studies on MyChart before your provider has had a chance to review them.  We understand that in some cases there may be results that are confusing or concerning to you. Not all laboratory results come back in the same time frame and the provider may be waiting for multiple results in order to interpret others.  Please give [place] 48 hours in order for  your provider to thoroughly review all the results before contacting the office for clarification of your results.   It was a pleasure to see you today!  Thank you for trusting me with your gastrointestinal care!

## 2022-06-08 NOTE — Progress Notes (Signed)
[facility] Progress Note  Chief Complaint: Right upper quadrant pain  Subjective  History: This visit was seen in follow-up for right upper quadrant pain.  She was seen at an outside practice in 2018 with negative endoscopic and lab work-up, those details outlined in her initial office note here from our [place] on 02/25/2022.  She was assigned to me that day though she had previously requested care with Dr. Fuller Plan.  Follow-up visit with our PA on 04/08/2022 with ongoing right upper quadrant pain, nausea, concerns of oral thrush.  Ultrasound and subsequent HIDA scan ordered.  She has chronic constipation and felt the MiraLAX did not agree with her.  She has ongoing symptoms and requested follow-up for further evaluation. [place] describes ongoing postprandial upper abdominal pain that is on both sides, usually brief and like a twinge.  She has nausea and belching as well.  She is mostly bothered by a frequent and at times severe pain across the right chest wall that she feels like might be centered in the thoracic spine.  That pain is there much of the time, worse with movements and she feels it also causes a headache and feelings of brain fog.  Although the upper digestive symptoms are similar to what brought her to evaluation of 2018, they seem worse in recent months.  Denies dysphagia odynophagia or weight loss.  She has been followed by a local orthopedic practice for a right humeral fracture and other arthritic problems, does not recall having an MRI of her back or other treatments for pain control  ROS: Cardiovascular:  no chest pain Respiratory: no dyspnea Remainder systems negative except as noted above The patient's Past Medical, Family and Social History were reviewed and are on file in the EMR.  Objective:  Med list reviewed  Current Outpatient Medications:    amLODipine (NORVASC) 10 MG tablet, Take 10 mg by mouth daily., Disp: , Rfl:    aspirin 81 MG chewable tablet, Chew 81  mg by mouth daily., Disp: , Rfl:    buPROPion (WELLBUTRIN XL) 300 MG 24 hr tablet, Take 300 mg by mouth daily., Disp: , Rfl:    cholecalciferol (VITAMIN D) 1000 UNITS tablet, Take 1,000 Units by mouth daily., Disp: , Rfl:    EC-NAPROXEN 500 MG EC tablet, Take 500 mg by mouth as needed., Disp: , Rfl:    esomeprazole (NEXIUM) 40 MG capsule, Take 1 capsule (40 mg total) by mouth daily., Disp: 90 capsule, Rfl: 3   famotidine (PEPCID) 20 MG tablet, Take 1 tablet (20 mg total) by mouth at bedtime., Disp: 90 tablet, Rfl: 3   irbesartan (AVAPRO) 75 MG tablet, Take 75 mg by mouth daily., Disp: , Rfl:    metoprolol tartrate (LOPRESSOR) 25 MG tablet, Take 12.5 mg by mouth at bedtime., Disp: , Rfl:    raloxifene (EVISTA) 60 MG tablet, Take 60 mg by mouth daily., Disp: , Rfl:    Red Yeast Rice 600 MG CAPS, Take 600 mg by mouth daily., Disp: , Rfl:    traZODone (DESYREL) 100 MG tablet, Take 100 mg by mouth at bedtime., Disp: , Rfl:    Vital signs in last 24 hrs: Vitals:   06/08/22 1050  BP: 122/78  Pulse: 90   Wt Readings from Last 3 Encounters:  06/08/22 152 lb 9.6 oz (69.2 kg)  04/08/22 155 lb 2 oz (70.4 kg)  02/25/22 158 lb 4 oz (71.8 kg)    Physical Exam  Well-appearing HEENT: sclera anicteric, oral  mucosa moist without lesions Neck: supple, no thyromegaly, JVD or lymphadenopathy Cardiac: Regular without murmur,  no peripheral edema Pulm: clear to auscultation bilaterally, normal RR and effort noted Abdomen: soft, multifocal abdominal wall tenderness, with active bowel sounds. No guarding or palpable hepatosplenomegaly.  She has focal tenderness along the right anterior and lateral costal margin as well as over the thoracic spine and paraspinous muscles. Skin; warm and dry, no jaundice or rash OA changes in her hands Labs:   ___________________________________________ Radiologic studies:  CLINICAL DATA:  Right upper quadrant abdominal pain for the past month.   EXAM: NUCLEAR  MEDICINE HEPATOBILIARY IMAGING WITH GALLBLADDER EF   TECHNIQUE: Sequential images of the abdomen were obtained out to 60 minutes following intravenous administration of radiopharmaceutical. After oral ingestion of Ensure, gallbladder ejection fraction was determined. At 60 min, normal ejection fraction is greater than 33%.   RADIOPHARMACEUTICALS:  5.3 mCi Tc-14m Choletec IV   COMPARISON:  Right upper quadrant abdominal ultrasound-04/16/2022.   FINDINGS: There is homogeneous distribution of injected radiotracer throughout the hepatic parenchyma.   There is early excretion of radiotracer with opacification of the common bile duct and proximal small bowel, initially seen on the 15 minutes anterior projection planar image.   There is early opacification of the gallbladder, initially seen on the 15 minute anterior projection planar image.   Following the uneventful ingestion of the Ensure, there is brisk emptying of the gallbladder with gallbladder ejection fraction of 88% at 60 minutes. (Normal gallbladder ejection fraction with Ensure is greater than 33%.)   IMPRESSION: No explanation for patient's right upper quadrant abdominal pain. Specifically, no evidence of acute cholecystitis or biliary dyskinesia. Normal gallbladder ejection fraction.     Electronically Signed   By: JSandi MariscalM.D.   On: 05/14/2022 15:18  CLINICAL DATA:  Right upper quadrant abdominal pain.   EXAM: ULTRASOUND ABDOMEN LIMITED RIGHT UPPER QUADRANT   COMPARISON:  None Available.   FINDINGS: Gallbladder:   No gallstones or wall thickening visualized. No sonographic Murphy sign noted by sonographer.   Common bile duct:   Diameter: 3 mm   Liver:   No focal lesion identified. Within normal limits in parenchymal echogenicity. Portal vein is patent on color Doppler imaging with normal direction of blood flow towards the liver.   Other: Note is made of a right renal cyst measuring 3.8 x 4.0 x  4.2 cm.   IMPRESSION: No cholelithiasis or sonographic evidence for acute cholecystitis.     Electronically Signed   By: DLovey NewcomerM.D.   On: 04/16/2022 14:58  ____________________________________________ Other:   _____________________________________________ Assessment & Plan  Assessment: Encounter Diagnoses  Name Primary?   RUQ pain Yes   Abdominal pain, epigastric    Slow transit constipation    Nausea in adult    While she does have chronic nausea and upper abdominal pain, it is not clear if she has a primary digestive condition to explain all of this.  Given her reported symptoms and physical exam findings, I think she has a chronic pain disorder with some ancillary nausea and bloating and headache.  She was concerned because of some previous EGD findings of a gastric polyp and also some acquaintances were diagnosed with pancreatic cancer this year.  Given her reported worsening symptoms, further GI work-up warranted, but she also needs to see her orthopedic practice for further evaluation and treatment of her arthritic issues. Perhaps primary care or other provider might consider the utility of duloxetine if appropriate  for her mood disorder, as it might also have benefits for chronic pain.  Chronic constipation stable Plan: Upper endoscopy scheduled.  She was agreeable after discussion of procedure and risks  CT scan abdomen with oral and IV contrast to rule out neoplastic cause of her upper GI symptoms  40 minutes were spent on this encounter (including chart review, history/exam, counseling/coordination of care, and documentation) > 50% of that time was spent on counseling and coordination of care.   Erika Luna

## 2022-06-13 ENCOUNTER — Encounter: Payer: Self-pay | Admitting: Certified Registered Nurse Anesthetist

## 2022-06-15 ENCOUNTER — Encounter: Payer: Self-pay | Admitting: Gastroenterology

## 2022-06-15 ENCOUNTER — Ambulatory Visit (AMBULATORY_SURGERY_CENTER): Payer: Medicare Other | Admitting: Gastroenterology

## 2022-06-15 VITALS — BP 136/82 | HR 93 | Temp 97.8°F | Resp 16 | Ht 62.0 in | Wt 155.0 lb

## 2022-06-15 DIAGNOSIS — R1011 Right upper quadrant pain: Secondary | ICD-10-CM | POA: Diagnosis not present

## 2022-06-15 DIAGNOSIS — R1013 Epigastric pain: Secondary | ICD-10-CM | POA: Diagnosis not present

## 2022-06-15 DIAGNOSIS — K317 Polyp of stomach and duodenum: Secondary | ICD-10-CM

## 2022-06-15 MED ORDER — SODIUM CHLORIDE 0.9 % IV SOLN: 500.0000 mL | Freq: Once | INTRAVENOUS | Status: DC

## 2022-06-15 NOTE — Progress Notes (Signed)
Report given to PACU, vss 

## 2022-06-15 NOTE — Progress Notes (Signed)
0946 Robinul 0.1 mg IV given due large amount of secretions upon assessment.  MD made aware, vss

## 2022-06-15 NOTE — Progress Notes (Signed)
Vs by [place] in adm  Pt's states no medical or surgical changes since previsit or office visit.

## 2022-06-15 NOTE — Op Note (Signed)
[place] Patient Name: Erika Luna Procedure Date: 06/15/2022 9:46 AM MRN: [id number] Endoscopist: Mallie Mussel L. Loletha Carrow , MD Age: 71 Referring MD:  Date of Birth: 1951-02-28 Gender: Female Account #: [id number] Procedure:                Upper GI endoscopy Indications:              Epigastric abdominal pain, Abdominal pain in the                            right upper quadrant (and right chest wall) - see                            06/08/22 office note for details Medicines:                Monitored Anesthesia Care Procedure:                Pre-Anesthesia Assessment:                           - Prior to the procedure, a History and Physical                            was performed, and patient medications and                            allergies were reviewed. The patient's tolerance of                            previous anesthesia was also reviewed. The risks                            and benefits of the procedure and the sedation                            options and risks were discussed with the patient.                            All questions were answered, and informed consent                            was obtained. Prior Anticoagulants: The patient has                            taken no previous anticoagulant or antiplatelet                            agents. ASA Grade Assessment: II - A patient with                            mild systemic disease. After reviewing the risks                            and benefits, the patient was deemed in  satisfactory condition to undergo the procedure.                           After obtaining informed consent, the endoscope was                            passed under direct vision. Throughout the                            procedure, the patient's blood pressure, pulse, and                            oxygen saturations were monitored continuously. The                            Endoscope was  introduced through the mouth, and                            advanced to the second part of duodenum. The upper                            GI endoscopy was accomplished without difficulty.                            The patient tolerated the procedure well. Scope In: Scope Out: Findings:                 The esophagus was normal.                           A few small sessile fundic gland polyps were found                            in the gastric body.                           The exam of the stomach was otherwise normal.                           The cardia and gastric fundus were normal on                            retroflexion.                           The examined duodenum was normal. Complications:            No immediate complications. Estimated Blood Loss:     Estimated blood loss: none. Impression:               - Normal esophagus.                           - A few fundic gland polyps.                           - Normal examined duodenum.                           -  No specimens collected. Recommendation:           - Patient has a contact number available for                            emergencies. The signs and symptoms of potential                            delayed complications were discussed with the                            patient. Return to normal activities tomorrow.                            Written discharge instructions were provided to the                            patient.                           - Resume previous diet.                           - Continue present medications.                           - Awaiting CTAP                           Patient planning visit to orthopedics about chronic                            mid back pain. Erika Luna L. Loletha Carrow, MD 06/15/2022 10:06:02 AM This report has been signed electronically.

## 2022-06-15 NOTE — Patient Instructions (Signed)
Please read handouts provided. Continue present medications.   YOU HAD AN ENDOSCOPIC PROCEDURE TODAY AT [facility]:   Refer to the procedure report that was given to you for any specific questions about what was found during the examination.  If the procedure report does not answer your questions, please call your gastroenterologist to clarify.  If you requested that your care partner not be given the details of your procedure findings, then the procedure report has been included in a sealed envelope for you to review at your convenience later.  YOU SHOULD EXPECT: Some feelings of bloating in the abdomen. Passage of more gas than usual.  Walking can help get rid of the air that was put into your GI tract during the procedure and reduce the bloating. If you had a lower endoscopy (such as a colonoscopy or flexible sigmoidoscopy) you may notice spotting of blood in your stool or on the toilet paper. If you underwent a bowel prep for your procedure, you may not have a normal bowel movement for a few days.  Please Note:  You might notice some irritation and congestion in your nose or some drainage.  This is from the oxygen used during your procedure.  There is no need for concern and it should clear up in a day or so.  SYMPTOMS TO REPORT IMMEDIATELY:  Following upper endoscopy (EGD)  Vomiting of blood or coffee ground material  New chest pain or pain under the shoulder blades  Painful or persistently difficult swallowing  New shortness of breath  Fever of 100F or higher  Black, tarry-looking stools  For urgent or emergent issues, a gastroenterologist can be reached at any hour by calling [phone]. Do not use MyChart messaging for urgent concerns.    DIET:  We do recommend a small meal at first, but then you may proceed to your regular diet.  Drink plenty of fluids but you should avoid alcoholic beverages for 24 hours.  ACTIVITY:  You should plan to take it easy for the  rest of today and you should NOT DRIVE or use heavy machinery until tomorrow (because of the sedation medicines used during the test).    FOLLOW UP: Our staff will call the number listed on your records the next business day following your procedure.  We will call around 7:15- 8:00 am to check on you and address any questions or concerns that you may have regarding the information given to you following your procedure. If we do not reach you, we will leave a message.  If you develop any symptoms (ie: fever, flu-like symptoms, shortness of breath, cough etc.) before then, please call [phone].  If you test positive for Covid 19 in the 2 weeks post procedure, please call and report this information to [place].    If any biopsies were taken you will be contacted by phone or by letter within the next 1-3 weeks.  Please call us at [phone] if you have not heard about the biopsies in 3 weeks.    SIGNATURES/CONFIDENTIALITY: You and/or your care partner have signed paperwork which will be entered into your electronic medical record.  These signatures attest to the fact that that the information above on your After Visit Summary has been reviewed and is understood.  Full responsibility of the confidentiality of this discharge information lies with you and/or your care-partner.

## 2022-06-15 NOTE — Progress Notes (Signed)
No changes to clinical history since GI office visit on 06/08/22.  The patient is appropriate for an endoscopic procedure in the ambulatory setting.  - Wilfrid Lund, MD

## 2022-06-16 ENCOUNTER — Telehealth: Payer: Self-pay

## 2022-06-16 NOTE — Telephone Encounter (Signed)
  Follow up Call-     06/15/2022    9:04 AM  Call back number  Post procedure Call Back phone  # [phone]  Permission to leave phone message Yes     Patient questions:  Do you have a fever, pain , or abdominal swelling? No. Pain Score  0 *  Have you tolerated food without any problems? Yes.    Have you been able to return to your normal activities? Yes.    Do you have any questions about your discharge instructions: Diet   No. Medications  No. Follow up visit  No.  Do you have questions or concerns about your Care? No.  Actions: * If pain score is 4 or above: No action needed, pain <4.

## 2022-06-18 ENCOUNTER — Ambulatory Visit (HOSPITAL_COMMUNITY)
Admission: RE | Admit: 2022-06-18 | Discharge: 2022-06-18 | Disposition: A | Discharge status: Home / Self Care | Payer: Medicare Other | Source: Ambulatory Visit | Attending: Gastroenterology | Admitting: Gastroenterology

## 2022-06-18 DIAGNOSIS — K5901 Slow transit constipation: Secondary | ICD-10-CM | POA: Diagnosis present

## 2022-06-18 MED ORDER — IOHEXOL 300 MG/ML  SOLN
100.0000 mL | Freq: Once | INTRAMUSCULAR | Status: AC | PRN
  Administered 2022-06-18: 100 mL via INTRAVENOUS

## 2022-06-18 MED ORDER — SODIUM CHLORIDE (PF) 0.9 % IJ SOLN
INTRAMUSCULAR | Status: AC
  Filled 2022-06-18: qty 50

## 2022-09-20 ENCOUNTER — Other Ambulatory Visit: Payer: Self-pay | Admitting: Orthopaedic Surgery

## 2022-10-27 ENCOUNTER — Encounter (HOSPITAL_COMMUNITY): Payer: Self-pay

## 2022-10-27 NOTE — Progress Notes (Signed)
PCP - Bradly Bienenstock, NP Cardiologist - n/a  Chest x-ray - n/a EKG - 10/28/22 Stress Test - 06/04/13; 03/22/17 [facility] ECHO - 01/15/09 Cardiac Cath - n/a  ICD Pacemaker/Loop - n/a  Sleep Study -  n/a CPAP - none  Aspirin Instructions: Follow your surgeon's instructions on when to stop aspirin prior to surgery,  If no instructions were given by your surgeon then you will need to call the office for those instructions.  ERAS: Clear liquids til 4:30 am DOS  CBC and CMP dated 10/21/22 located in CE.  Anesthesia review: Yes  STOP now taking any Aspirin (unless otherwise instructed by your surgeon), Aleve, Naproxen, Ibuprofen, Motrin, Advil, Goody's, BC's, all herbal medications, fish oil, and all vitamins.   Coronavirus Screening Do you have any of the following symptoms:  Cough yes/no: No Fever (>100.86F)  yes/no: No Runny nose yes/no: No Sore throat yes/no: No Difficulty breathing/shortness of breath  yes/no: No  Have you traveled in the last 14 days and where? yes/no: No  Patient verbalized understanding of instructions that were given to them at the PAT appointment. Patient was also instructed that they will need to review over the PAT instructions again at home before surgery.

## 2022-10-27 NOTE — Progress Notes (Signed)
Surgical Instructions    Your procedure is scheduled on Thursday December 28th.  Report to [facility] Main Entrance "A" at 5:30 A.M., then check in with the Admitting office.  Call this number if you have problems the morning of surgery:  [phone]   If you have any questions prior to your surgery date call [phone]: Open Monday-Friday 8am-4pm If you experience any cold or flu symptoms such as cough, fever, chills, shortness of breath, etc. between now and your scheduled surgery, please notify us at the above number     Remember:  Do not eat after midnight the night before your surgery  You may drink clear liquids until 4:30am the morning of your surgery.   Clear liquids allowed are: Water, Non-Citrus Juices (without pulp), Carbonated Beverages, Clear Tea, Black Coffee ONLY (NO MILK, CREAM OR POWDERED CREAMER of any kind), and Gatorade   Enhanced Recovery after Surgery for Orthopedics Enhanced Recovery after Surgery is a protocol used to improve the stress on your body and your recovery after surgery.  Patient Instructions  The day of surgery (if you do NOT have diabetes):  Drink ONE (1) Pre-Surgery Clear Ensure by __4:30___ am the morning of surgery   This drink was given to you during your hospital  pre-op appointment visit. Nothing else to drink after completing the  Pre-Surgery Clear Ensure.          If you have questions, please contact your surgeon's office.     Take these medicines the morning of surgery with A SIP OF WATER: amLODipine (NORVASC) 10 MG tablet  buPROPion (WELLBUTRIN XL) 300 MG 24 hr tablet  esomeprazole (NEXIUM) 40 MG capsule  raloxifene (EVISTA) 60 MG tablet   Follow your surgeon's instructions on when to stop Aspirin.  If no instructions were given by your surgeon then you will need to call the office to get those instructions.     As of today, STOP taking any Aspirin (unless otherwise instructed by your surgeon) Aleve, Naproxen, Ibuprofen,  Motrin, Advil, Goody's, BC's, all herbal medications, fish oil, and all vitamins.           Do not wear jewelry or makeup. Do not wear lotions, powders, perfumes or deodorant. Do not shave 48 hours prior to surgery.   Do not bring valuables to the hospital. Do not wear nail polish, gel polish, artificial nails, or any other type of covering on natural nails (fingers and toes) If you have artificial nails or gel coating that need to be removed by a nail salon, please have this removed prior to surgery. Artificial nails or gel coating may interfere with anesthesia's ability to adequately monitor your vital signs.  [facility] is not responsible for any belongings or valuables.    Do NOT Smoke (Tobacco/Vaping)  24 hours prior to your procedure  If you use a CPAP at night, you may bring your mask for your overnight stay.   Contacts, glasses, hearing aids, dentures or partials may not be worn into surgery, please bring cases for these belongings   For patients admitted to the hospital, discharge time will be determined by your treatment team.   Patients discharged the day of surgery will not be allowed to drive home, and someone needs to stay with them for 24 hours.   SURGICAL WAITING ROOM VISITATION Patients having surgery or a procedure may have no more than 2 support people in the waiting area - these visitors may rotate.   Children under the age of  16 must have an adult with them who is not the patient. If the patient needs to stay at the hospital during part of their recovery, the visitor guidelines for inpatient rooms apply. Pre-op nurse will coordinate an appropriate time for 1 support person to accompany patient in pre-op.  This support person may not rotate.   Please refer to [email or web address] for the visitor guidelines for Inpatients (after your surgery is over and you are in a regular room).    Special instructions:    Oral  Hygiene is also important to reduce your risk of infection.  Remember - BRUSH YOUR TEETH THE MORNING OF SURGERY WITH YOUR REGULAR TOOTHPASTE   [facility]- Preparing For Surgery  Before surgery, you can play an important role. Because skin is not sterile, your skin needs to be as free of germs as possible. You can reduce the number of germs on your skin by washing with CHG (chlorahexidine gluconate) Soap before surgery.  CHG is an antiseptic cleaner which kills germs and bonds with the skin to continue killing germs even after washing.     Please do not use if you have an allergy to CHG or antibacterial soaps. If your skin becomes reddened/irritated stop using the CHG.  Do not shave (including legs and underarms) for at least 48 hours prior to first CHG shower. It is OK to shave your face.  Please follow these instructions carefully.     Shower the NIGHT BEFORE SURGERY and the MORNING OF SURGERY with CHG Soap.   If you chose to wash your hair, wash your hair first as usual with your normal shampoo. After you shampoo, rinse your hair and body thoroughly to remove the shampoo.  Then [employer] and genitals (private parts) with your normal soap and rinse thoroughly to remove soap.  After that Use CHG Soap as you would any other liquid soap. You can apply CHG directly to the skin and wash gently with a scrungie or a clean washcloth.   Apply the CHG Soap to your body ONLY FROM THE NECK DOWN.  Do not use on open wounds or open sores. Avoid contact with your eyes, ears, mouth and genitals (private parts). Wash Face and genitals (private parts)  with your normal soap.   Wash thoroughly, paying special attention to the area where your surgery will be performed.  Thoroughly rinse your body with warm water from the neck down.  DO NOT shower/wash with your normal soap after using and rinsing off the CHG Soap.  Pat yourself dry with a CLEAN TOWEL.  Wear CLEAN PAJAMAS to bed the night before  surgery  Place CLEAN SHEETS on your bed the night before your surgery  DO NOT SLEEP WITH PETS.   Day of Surgery:  Take a shower with CHG soap. Wear Clean/Comfortable clothing the morning of surgery Do not apply any deodorants/lotions.   Remember to brush your teeth WITH YOUR REGULAR TOOTHPASTE.    If you received a COVID test during your pre-op visit, it is requested that you wear a mask when out in public, stay away from anyone that may not be feeling well, and notify your surgeon if you develop symptoms. If you have been in contact with anyone that has tested positive in the last 10 days, please notify your surgeon.    Please read over the following fact sheets that you were given.

## 2022-10-28 ENCOUNTER — Encounter (HOSPITAL_COMMUNITY): Payer: Self-pay

## 2022-10-28 ENCOUNTER — Other Ambulatory Visit: Payer: Self-pay

## 2022-10-28 ENCOUNTER — Encounter (HOSPITAL_COMMUNITY)
Admission: RE | Admit: 2022-10-28 | Discharge: 2022-10-28 | Disposition: A | Discharge status: Home / Self Care | Payer: Medicare Other | Source: Ambulatory Visit | Attending: Orthopaedic Surgery | Admitting: Orthopaedic Surgery

## 2022-10-28 VITALS — BP 140/80 | HR 77 | Temp 97.6°F | Resp 18 | Ht 62.0 in | Wt 153.4 lb

## 2022-10-28 DIAGNOSIS — Z01818 Encounter for other preprocedural examination: Secondary | ICD-10-CM | POA: Insufficient documentation

## 2022-10-28 DIAGNOSIS — Z22322 Carrier or suspected carrier of Methicillin resistant Staphylococcus aureus: Secondary | ICD-10-CM

## 2022-10-28 HISTORY — DX: Carrier or suspected carrier of methicillin resistant Staphylococcus aureus: Z22.322

## 2022-10-28 HISTORY — DX: Other complications of anesthesia, initial encounter: T88.59XA

## 2022-10-28 LAB — TYPE AND SCREEN
ABO/RH(D): AB POS
Antibody Screen: NEGATIVE

## 2022-10-28 LAB — SURGICAL PCR SCREEN
MRSA, PCR: POSITIVE — AB
Staphylococcus aureus: POSITIVE — AB

## 2022-11-01 NOTE — Progress Notes (Signed)
Anesthesia Chart Review:  Prior cardiology eval in 2018 for chest discomfort and palpitations. She had a treadmill stress echo done at [facility] 03/22/2017. Results not available for review, however, cardiology OV note from 09/06/2017 states, "normal stress echocardiogram." 30 day even monitor showed predominantly NSR, no significant dysrhythmias or prolonged pauses.  CBC 10/21/22 in care everywhere reviewed, WNL.  CMP 10/21/22 in care everywhere reviewed, creatinine mildly elevated 1.10, otherwise WNL.  EKG 10/28/2022: NSR. Rate 77.  30 day event monitor 10/10/22 (care everywhere): Cardiac event recorder reveals predominantly normal sinus rhythm with an average heart rate of 83. Rare isolated PACs and PVCs are noted that are asymptomatic. Symptoms are recorded and generally correlate with episodes of atrial couplets and/or brief atrial runs. No atrial fibrillation is noted. No prolonged pauses are noted.    Wynonia Musty [facility] Short Stay Center/Anesthesiology Phone [phone] 11/01/2022 1:08 PM

## 2022-11-01 NOTE — Anesthesia Preprocedure Evaluation (Addendum)
Anesthesia Evaluation  Patient identified by MRN, date of birth, ID band Patient awake    Reviewed: Allergy & Precautions, NPO status , Patient's Chart, lab work & pertinent test results  History of Anesthesia Complications (+) history of anesthetic complications  Airway Mallampati: II  TM Distance: >3 FB Neck ROM: Full    Dental  (+) Dental Advisory Given, Teeth Intact   Pulmonary neg pulmonary ROS   Pulmonary exam normal breath sounds clear to auscultation       Cardiovascular hypertension, Pt. on medications Normal cardiovascular exam Rhythm:Regular Rate:Normal     Neuro/Psych  Headaches PSYCHIATRIC DISORDERS Anxiety Depression       GI/Hepatic Neg liver ROS, PUD,GERD  Medicated,,  Endo/Other  negative endocrine ROS    Renal/GU negative Renal ROS     Musculoskeletal  (+) Arthritis ,    Abdominal   Peds  Hematology  (+) Blood dyscrasia, anemia   Anesthesia Other Findings   Reproductive/Obstetrics                             Anesthesia Physical Anesthesia Plan  ASA: 2  Anesthesia Plan: General   Post-op Pain Management: Tylenol PO (pre-op)*, Celebrex PO (pre-op)* and Regional block*   Induction: Intravenous  PONV Risk Score and Plan: 3 and Ondansetron, Dexamethasone and Treatment may vary due to age or medical condition  Airway Management Planned: Oral ETT  Additional Equipment: None  Intra-op Plan:   Post-operative Plan: Extubation in OR  Informed Consent: I have reviewed the patients History and Physical, chart, labs and discussed the procedure including the risks, benefits and alternatives for the proposed anesthesia with the patient or authorized representative who has indicated his/her understanding and acceptance.     Dental advisory given  Plan Discussed with: CRNA  Anesthesia Plan Comments: (PAT note by Karoline Caldwell, PA-C: Prior cardiology eval in 2018 for  chest discomfort and palpitations. She had a treadmill stress echo done at [facility] 03/22/2017. Results not available for review, however, cardiology OV note from 09/06/2017 states, "normal stress echocardiogram." 30 day even monitor showed predominantly NSR, no significant dysrhythmias or prolonged pauses.  CBC 10/21/22 in care everywhere reviewed, WNL.  CMP 10/21/22 in care everywhere reviewed, creatinine mildly elevated 1.10, otherwise WNL.  EKG 10/28/2022: NSR. Rate 77.  30 day event monitor 10/10/22 (care everywhere): Cardiac event recorder reveals predominantly normal sinus rhythm with an average heart rate of 83. Rare isolated PACs and PVCs are noted that are asymptomatic. Symptoms are recorded and generally correlate with episodes of atrial couplets and/or brief atrial runs. No atrial fibrillation is noted. No prolonged pauses are noted.   )        Anesthesia Quick Evaluation

## 2022-11-11 ENCOUNTER — Encounter (HOSPITAL_COMMUNITY): Payer: Self-pay | Admitting: Orthopaedic Surgery

## 2022-11-11 ENCOUNTER — Other Ambulatory Visit: Payer: Self-pay

## 2022-11-11 ENCOUNTER — Encounter (HOSPITAL_COMMUNITY)
Admission: RE | Disposition: A | Discharge status: Home / Self Care | Payer: Self-pay | Source: Ambulatory Visit | Attending: Orthopaedic Surgery

## 2022-11-11 ENCOUNTER — Ambulatory Visit (HOSPITAL_COMMUNITY)
Admission: RE | Admit: 2022-11-11 | Discharge: 2022-11-13 | Disposition: A | Discharge status: Home / Self Care | Payer: Medicare Other | Source: Ambulatory Visit | Attending: Orthopaedic Surgery | Admitting: Orthopaedic Surgery

## 2022-11-11 ENCOUNTER — Ambulatory Visit (HOSPITAL_COMMUNITY): Payer: Medicare Other

## 2022-11-11 ENCOUNTER — Ambulatory Visit (HOSPITAL_BASED_OUTPATIENT_CLINIC_OR_DEPARTMENT_OTHER): Payer: Medicare Other | Admitting: Physician Assistant

## 2022-11-11 ENCOUNTER — Ambulatory Visit (HOSPITAL_COMMUNITY): Payer: Medicare Other | Admitting: Physician Assistant

## 2022-11-11 DIAGNOSIS — K219 Gastro-esophageal reflux disease without esophagitis: Secondary | ICD-10-CM | POA: Insufficient documentation

## 2022-11-11 DIAGNOSIS — Z79899 Other long term (current) drug therapy: Secondary | ICD-10-CM | POA: Insufficient documentation

## 2022-11-11 DIAGNOSIS — M19072 Primary osteoarthritis, left ankle and foot: Secondary | ICD-10-CM | POA: Diagnosis present

## 2022-11-11 DIAGNOSIS — M25772 Osteophyte, left ankle: Secondary | ICD-10-CM | POA: Diagnosis not present

## 2022-11-11 DIAGNOSIS — Z8711 Personal history of peptic ulcer disease: Secondary | ICD-10-CM | POA: Insufficient documentation

## 2022-11-11 DIAGNOSIS — I10 Essential (primary) hypertension: Secondary | ICD-10-CM | POA: Insufficient documentation

## 2022-11-11 DIAGNOSIS — M25372 Other instability, left ankle: Secondary | ICD-10-CM | POA: Diagnosis not present

## 2022-11-11 DIAGNOSIS — F418 Other specified anxiety disorders: Secondary | ICD-10-CM | POA: Diagnosis not present

## 2022-11-11 DIAGNOSIS — M1990 Unspecified osteoarthritis, unspecified site: Secondary | ICD-10-CM | POA: Diagnosis present

## 2022-11-11 HISTORY — PX: TOTAL ANKLE ARTHROPLASTY: SHX811

## 2022-11-11 HISTORY — PX: LIGAMENT REPAIR: SHX5444

## 2022-11-11 HISTORY — DX: Primary osteoarthritis, left ankle and foot: M19.072

## 2022-11-11 LAB — CREATININE, SERUM
Creatinine, Ser: 1.12 mg/dL — ABNORMAL HIGH (ref 0.44–1.00)
GFR, Estimated: 53 mL/min — ABNORMAL LOW (ref >60)

## 2022-11-11 LAB — CBC
HCT: 36.2 % (ref 36.0–46.0)
Hemoglobin: 11.5 g/dL — ABNORMAL LOW (ref 12.0–15.0)
MCH: 27.8 pg (ref 26.0–34.0)
MCHC: 31.8 g/dL (ref 30.0–36.0)
MCV: 87.4 fL (ref 80.0–100.0)
Platelets: 281 10*3/uL (ref 150–400)
RBC: 4.14 MIL/uL (ref 3.87–5.11)
RDW: 14.2 % (ref 11.5–15.5)
WBC: 13.1 10*3/uL — ABNORMAL HIGH (ref 4.0–10.5)
nRBC: 0 % (ref 0.0–0.2)

## 2022-11-11 LAB — ABO/RH: ABO/RH(D): AB POS

## 2022-11-11 SURGERY — ARTHROPLASTY, ANKLE, TOTAL
Anesthesia: General | Site: Ankle | Laterality: Left

## 2022-11-11 MED ORDER — DOCUSATE SODIUM 100 MG PO CAPS
100.0000 mg | ORAL_CAPSULE | Freq: Two times a day (BID) | ORAL | Status: DC
  Administered 2022-11-11 – 2022-11-13 (×5): 100 mg via ORAL
  Filled 2022-11-11 (×5): qty 1

## 2022-11-11 MED ORDER — PHENYLEPHRINE HCL-NACL 20-0.9 MG/250ML-% IV SOLN
INTRAVENOUS | Status: DC | PRN
  Administered 2022-11-11: 20 ug/min via INTRAVENOUS

## 2022-11-11 MED ORDER — CEFAZOLIN SODIUM-DEXTROSE 2-4 GM/100ML-% IV SOLN
2.0000 g | INTRAVENOUS | Status: AC
  Administered 2022-11-11: 2 g via INTRAVENOUS
  Filled 2022-11-11: qty 100

## 2022-11-11 MED ORDER — RALOXIFENE HCL 60 MG PO TABS
60.0000 mg | ORAL_TABLET | Freq: Every evening | ORAL | Status: DC
  Administered 2022-11-12: 60 mg via ORAL
  Filled 2022-11-11 (×3): qty 1

## 2022-11-11 MED ORDER — CELECOXIB 200 MG PO CAPS
200.0000 mg | ORAL_CAPSULE | Freq: Once | ORAL | Status: AC
  Administered 2022-11-11: 200 mg via ORAL
  Filled 2022-11-11: qty 1

## 2022-11-11 MED ORDER — VANCOMYCIN HCL 750 MG/150ML IV SOLN
750.0000 mg | Freq: Two times a day (BID) | INTRAVENOUS | Status: AC
  Administered 2022-11-12: 750 mg via INTRAVENOUS
  Filled 2022-11-11: qty 150

## 2022-11-11 MED ORDER — CHLORHEXIDINE GLUCONATE 0.12 % MT SOLN
15.0000 mL | Freq: Once | OROMUCOSAL | Status: DC
  Filled 2022-11-11: qty 15

## 2022-11-11 MED ORDER — CELECOXIB 200 MG PO CAPS
200.0000 mg | ORAL_CAPSULE | Freq: Two times a day (BID) | ORAL | Status: DC
  Administered 2022-11-11 – 2022-11-13 (×5): 200 mg via ORAL
  Filled 2022-11-11 (×5): qty 1

## 2022-11-11 MED ORDER — ACETAMINOPHEN 325 MG PO TABS: 325.0000 mg | ORAL_TABLET | Freq: Four times a day (QID) | ORAL | Status: DC | PRN

## 2022-11-11 MED ORDER — ROPIVACAINE HCL 5 MG/ML IJ SOLN
INTRAMUSCULAR | Status: DC | PRN
  Administered 2022-11-11: 20 mL via PERINEURAL
  Administered 2022-11-11: 30 mL via PERINEURAL

## 2022-11-11 MED ORDER — ONDANSETRON HCL 4 MG PO TABS: 4.0000 mg | ORAL_TABLET | Freq: Four times a day (QID) | ORAL | Status: DC | PRN

## 2022-11-11 MED ORDER — FENTANYL CITRATE (PF) 250 MCG/5ML IJ SOLN
INTRAMUSCULAR | Status: AC
  Filled 2022-11-11: qty 5

## 2022-11-11 MED ORDER — CLONIDINE HCL (ANALGESIA) 100 MCG/ML EP SOLN
EPIDURAL | Status: DC | PRN
  Administered 2022-11-11: 50 ug
  Administered 2022-11-11: 30 ug

## 2022-11-11 MED ORDER — TRAZODONE HCL 50 MG PO TABS
100.0000 mg | ORAL_TABLET | Freq: Every day | ORAL | Status: DC
  Administered 2022-11-11 – 2022-11-12 (×2): 100 mg via ORAL
  Filled 2022-11-11 (×2): qty 2

## 2022-11-11 MED ORDER — LIDOCAINE 2% (20 MG/ML) 5 ML SYRINGE
INTRAMUSCULAR | Status: DC | PRN
  Administered 2022-11-11: 70 mg via INTRAVENOUS

## 2022-11-11 MED ORDER — HYDROCODONE-ACETAMINOPHEN 5-325 MG PO TABS
1.0000 | ORAL_TABLET | ORAL | Status: DC | PRN
  Administered 2022-11-12 (×2): 1 via ORAL
  Filled 2022-11-11 (×2): qty 1

## 2022-11-11 MED ORDER — HYDROMORPHONE HCL 1 MG/ML IJ SOLN: 0.2500 mg | INTRAMUSCULAR | Status: DC | PRN

## 2022-11-11 MED ORDER — METHOCARBAMOL 500 MG PO TABS
500.0000 mg | ORAL_TABLET | Freq: Four times a day (QID) | ORAL | Status: DC | PRN
  Administered 2022-11-12 – 2022-11-13 (×3): 500 mg via ORAL
  Filled 2022-11-11 (×4): qty 1

## 2022-11-11 MED ORDER — 0.9 % SODIUM CHLORIDE (POUR BTL) OPTIME
TOPICAL | Status: DC | PRN
  Administered 2022-11-11: 1000 mL

## 2022-11-11 MED ORDER — PANTOPRAZOLE SODIUM 40 MG PO TBEC
80.0000 mg | DELAYED_RELEASE_TABLET | Freq: Every day | ORAL | Status: DC
  Administered 2022-11-13: 80 mg via ORAL
  Filled 2022-11-11 (×2): qty 2

## 2022-11-11 MED ORDER — PROPOFOL 10 MG/ML IV BOLUS
INTRAVENOUS | Status: DC | PRN
  Administered 2022-11-11: 120 mg via INTRAVENOUS

## 2022-11-11 MED ORDER — HYDROMORPHONE HCL 1 MG/ML IJ SOLN: 0.5000 mg | INTRAMUSCULAR | Status: DC | PRN

## 2022-11-11 MED ORDER — FAMOTIDINE 20 MG PO TABS
20.0000 mg | ORAL_TABLET | Freq: Every day | ORAL | Status: DC
  Administered 2022-11-11 – 2022-11-12 (×2): 20 mg via ORAL
  Filled 2022-11-11 (×2): qty 1

## 2022-11-11 MED ORDER — VANCOMYCIN HCL IN DEXTROSE 1-5 GM/200ML-% IV SOLN
1000.0000 mg | INTRAVENOUS | Status: AC
  Administered 2022-11-11: 1000 mg via INTRAVENOUS
  Filled 2022-11-11: qty 200

## 2022-11-11 MED ORDER — ONDANSETRON HCL 4 MG/2ML IJ SOLN: 4.0000 mg | Freq: Four times a day (QID) | INTRAMUSCULAR | Status: DC | PRN

## 2022-11-11 MED ORDER — IRBESARTAN 150 MG PO TABS
75.0000 mg | ORAL_TABLET | Freq: Every day | ORAL | Status: DC
  Administered 2022-11-12 – 2022-11-13 (×2): 75 mg via ORAL
  Filled 2022-11-11 (×2): qty 1

## 2022-11-11 MED ORDER — ENOXAPARIN SODIUM 40 MG/0.4ML IJ SOSY
40.0000 mg | PREFILLED_SYRINGE | INTRAMUSCULAR | Status: DC
  Administered 2022-11-12 – 2022-11-13 (×2): 40 mg via SUBCUTANEOUS
  Filled 2022-11-11 (×2): qty 0.4

## 2022-11-11 MED ORDER — AMLODIPINE BESYLATE 5 MG PO TABS
10.0000 mg | ORAL_TABLET | Freq: Every day | ORAL | Status: DC
  Administered 2022-11-12 – 2022-11-13 (×2): 10 mg via ORAL
  Filled 2022-11-11 (×2): qty 2

## 2022-11-11 MED ORDER — ACETAMINOPHEN 500 MG PO TABS
1000.0000 mg | ORAL_TABLET | Freq: Once | ORAL | Status: AC
  Administered 2022-11-11: 1000 mg via ORAL
  Filled 2022-11-11: qty 2

## 2022-11-11 MED ORDER — ONDANSETRON HCL 4 MG/2ML IJ SOLN
INTRAMUSCULAR | Status: DC | PRN
  Administered 2022-11-11: 4 mg via INTRAVENOUS

## 2022-11-11 MED ORDER — HYDROCODONE-ACETAMINOPHEN 7.5-325 MG PO TABS
1.0000 | ORAL_TABLET | ORAL | Status: DC | PRN
  Administered 2022-11-12 – 2022-11-13 (×4): 1 via ORAL
  Filled 2022-11-11 (×4): qty 1

## 2022-11-11 MED ORDER — PHENYLEPHRINE 80 MCG/ML (10ML) SYRINGE FOR IV PUSH (FOR BLOOD PRESSURE SUPPORT)
PREFILLED_SYRINGE | INTRAVENOUS | Status: DC | PRN
  Administered 2022-11-11 (×5): 80 ug via INTRAVENOUS

## 2022-11-11 MED ORDER — MEPERIDINE HCL 25 MG/ML IJ SOLN: 6.2500 mg | INTRAMUSCULAR | Status: DC | PRN

## 2022-11-11 MED ORDER — METHOCARBAMOL 1000 MG/10ML IJ SOLN: 500.0000 mg | Freq: Four times a day (QID) | INTRAVENOUS | Status: DC | PRN

## 2022-11-11 MED ORDER — ORAL CARE MOUTH RINSE: 15.0000 mL | Freq: Once | OROMUCOSAL | Status: DC

## 2022-11-11 MED ORDER — ESCITALOPRAM OXALATE 10 MG PO TABS
10.0000 mg | ORAL_TABLET | Freq: Every day | ORAL | Status: DC
  Administered 2022-11-12 – 2022-11-13 (×2): 10 mg via ORAL
  Filled 2022-11-11 (×2): qty 1

## 2022-11-11 MED ORDER — DEXAMETHASONE SODIUM PHOSPHATE 4 MG/ML IJ SOLN
INTRAMUSCULAR | Status: DC | PRN
  Administered 2022-11-11: 2 mg via PERINEURAL
  Administered 2022-11-11: 3 mg via PERINEURAL

## 2022-11-11 MED ORDER — STERILE WATER FOR IRRIGATION IR SOLN
Status: DC | PRN
  Administered 2022-11-11: 1000 mL

## 2022-11-11 MED ORDER — DIPHENHYDRAMINE HCL 12.5 MG/5ML PO ELIX: 12.5000 mg | ORAL_SOLUTION | ORAL | Status: DC | PRN

## 2022-11-11 MED ORDER — PROPOFOL 10 MG/ML IV BOLUS
INTRAVENOUS | Status: AC
  Filled 2022-11-11: qty 20

## 2022-11-11 MED ORDER — ROCURONIUM BROMIDE 10 MG/ML (PF) SYRINGE
PREFILLED_SYRINGE | INTRAVENOUS | Status: AC
  Filled 2022-11-11: qty 10

## 2022-11-11 MED ORDER — VANCOMYCIN HCL 500 MG IV SOLR
INTRAVENOUS | Status: AC
  Filled 2022-11-11: qty 10

## 2022-11-11 MED ORDER — ACETAMINOPHEN 500 MG PO TABS
500.0000 mg | ORAL_TABLET | Freq: Four times a day (QID) | ORAL | Status: AC
  Administered 2022-11-11 – 2022-11-12 (×3): 500 mg via ORAL
  Filled 2022-11-11 (×3): qty 1

## 2022-11-11 MED ORDER — METOCLOPRAMIDE HCL 5 MG PO TABS: 5.0000 mg | ORAL_TABLET | Freq: Three times a day (TID) | ORAL | Status: DC | PRN

## 2022-11-11 MED ORDER — PROMETHAZINE HCL 25 MG/ML IJ SOLN: 6.2500 mg | INTRAMUSCULAR | Status: DC | PRN

## 2022-11-11 MED ORDER — LIDOCAINE 2% (20 MG/ML) 5 ML SYRINGE
INTRAMUSCULAR | Status: AC
  Filled 2022-11-11: qty 5

## 2022-11-11 MED ORDER — METOCLOPRAMIDE HCL 5 MG/ML IJ SOLN: 5.0000 mg | Freq: Three times a day (TID) | INTRAMUSCULAR | Status: DC | PRN

## 2022-11-11 MED ORDER — MIDAZOLAM HCL 2 MG/2ML IJ SOLN
INTRAMUSCULAR | Status: AC
  Filled 2022-11-11: qty 2

## 2022-11-11 MED ORDER — PHENYLEPHRINE 80 MCG/ML (10ML) SYRINGE FOR IV PUSH (FOR BLOOD PRESSURE SUPPORT)
PREFILLED_SYRINGE | INTRAVENOUS | Status: AC
  Filled 2022-11-11: qty 10

## 2022-11-11 MED ORDER — LACTATED RINGERS IV SOLN: INTRAVENOUS | Status: DC

## 2022-11-11 MED ORDER — ONDANSETRON HCL 4 MG/2ML IJ SOLN
INTRAMUSCULAR | Status: AC
  Filled 2022-11-11: qty 2

## 2022-11-11 MED ORDER — MIDAZOLAM HCL 2 MG/2ML IJ SOLN
INTRAMUSCULAR | Status: DC | PRN
  Administered 2022-11-11: 1 mg via INTRAVENOUS

## 2022-11-11 MED ORDER — ROCURONIUM BROMIDE 10 MG/ML (PF) SYRINGE
PREFILLED_SYRINGE | INTRAVENOUS | Status: DC | PRN
  Administered 2022-11-11: 60 mg via INTRAVENOUS
  Administered 2022-11-11 (×2): 20 mg via INTRAVENOUS

## 2022-11-11 MED ORDER — FENTANYL CITRATE (PF) 250 MCG/5ML IJ SOLN
INTRAMUSCULAR | Status: DC | PRN
  Administered 2022-11-11: 25 ug via INTRAVENOUS
  Administered 2022-11-11 (×3): 50 ug via INTRAVENOUS

## 2022-11-11 MED ORDER — BUPROPION HCL ER (XL) 300 MG PO TB24
300.0000 mg | ORAL_TABLET | Freq: Every day | ORAL | Status: DC
  Administered 2022-11-12 – 2022-11-13 (×2): 300 mg via ORAL
  Filled 2022-11-11 (×2): qty 1

## 2022-11-11 MED ORDER — DEXAMETHASONE SODIUM PHOSPHATE 10 MG/ML IJ SOLN
INTRAMUSCULAR | Status: AC
  Filled 2022-11-11: qty 1

## 2022-11-11 MED ORDER — METOPROLOL TARTRATE 12.5 MG HALF TABLET
12.5000 mg | ORAL_TABLET | Freq: Every day | ORAL | Status: DC
  Administered 2022-11-11 – 2022-11-12 (×2): 12.5 mg via ORAL
  Filled 2022-11-11 (×2): qty 1

## 2022-11-11 MED ORDER — OLOPATADINE HCL 0.1 % OP SOLN
1.0000 [drp] | Freq: Every day | OPHTHALMIC | Status: DC
  Filled 2022-11-11: qty 5

## 2022-11-11 MED ORDER — HYDROCODONE-ACETAMINOPHEN 5-325 MG PO TABS: 1.0000 | ORAL_TABLET | ORAL | Status: DC | PRN

## 2022-11-11 MED ORDER — SUGAMMADEX SODIUM 200 MG/2ML IV SOLN
INTRAVENOUS | Status: DC | PRN
  Administered 2022-11-11: 200 mg via INTRAVENOUS

## 2022-11-11 MED ORDER — DEXAMETHASONE SODIUM PHOSPHATE 10 MG/ML IJ SOLN
INTRAMUSCULAR | Status: DC | PRN
  Administered 2022-11-11: 5 mg via INTRAVENOUS

## 2022-11-11 SURGICAL SUPPLY — 73 items
BAG COUNTER SPONGE SURGICOUNT (BAG) ×2 IMPLANT
BANDAGE ESMARK 6X9 LF (GAUZE/BANDAGES/DRESSINGS) ×2 IMPLANT
BIT DRILL 2.6 CANN (BIT) IMPLANT
BLADE RECIPRO TAPERED (BLADE) ×2 IMPLANT
BLADE SAW RECIP 87.9 MT (BLADE) IMPLANT
BLADE SAW RECIP ANKLE 8X50X1 (PIN) IMPLANT
BLADE SURG 15 STRL LF DISP TIS (BLADE) ×4 IMPLANT
BLADE SURG 15 STRL SS (BLADE) ×6
BNDG ELASTIC 6X5.8 VLCR STR LF (GAUZE/BANDAGES/DRESSINGS) IMPLANT
BNDG ESMARK 6X9 LF (GAUZE/BANDAGES/DRESSINGS) ×2
CHLORAPREP W/TINT 26 (MISCELLANEOUS) ×2 IMPLANT
CLIP LOCK ANKLE SZ1 (Ankle) IMPLANT
COVER MAYO STAND STRL (DRAPES) IMPLANT
COVER SURGICAL LIGHT HANDLE (MISCELLANEOUS) ×2 IMPLANT
CUFF TOURN SGL QUICK 34 (TOURNIQUET CUFF) ×2
CUFF TRNQT CYL 34X4.125X (TOURNIQUET CUFF) ×2 IMPLANT
DRAPE C-ARM 42X72 X-RAY (DRAPES) ×2 IMPLANT
DRAPE C-ARMOR (DRAPES) ×2 IMPLANT
DRAPE HALF SHEET 40X57 (DRAPES) IMPLANT
DRAPE U-SHAPE 47X51 STRL (DRAPES) ×2 IMPLANT
DRESSING MEPILEX FLEX 4X4 (GAUZE/BANDAGES/DRESSINGS) IMPLANT
DRSG MEPILEX BORDER 4X4 (GAUZE/BANDAGES/DRESSINGS) ×2 IMPLANT
DRSG MEPILEX FLEX 4X4 (GAUZE/BANDAGES/DRESSINGS) ×2
ELECT REM PT RETURN 9FT ADLT (ELECTROSURGICAL) ×2
ELECTRODE REM PT RTRN 9FT ADLT (ELECTROSURGICAL) ×2 IMPLANT
FACESHIELD WRAPAROUND (MASK) IMPLANT
FACESHIELD WRAPAROUND OR TEAM (MASK) IMPLANT
GAUZE PAD ABD 8X10 STRL (GAUZE/BANDAGES/DRESSINGS) ×4 IMPLANT
GAUZE SPONGE 4X4 12PLY STRL (GAUZE/BANDAGES/DRESSINGS) ×4 IMPLANT
GAUZE XEROFORM 5X9 LF (GAUZE/BANDAGES/DRESSINGS) ×2 IMPLANT
GLOVE BIO SURGEON STRL SZ8 (GLOVE) ×2 IMPLANT
GLOVE BIOGEL M STRL SZ7.5 (GLOVE) ×2 IMPLANT
GLOVE BIOGEL PI IND STRL 6.5 (GLOVE) IMPLANT
GLOVE BIOGEL PI IND STRL 8 (GLOVE) ×4 IMPLANT
GLOVE ECLIPSE 6.5 STRL STRAW (GLOVE) IMPLANT
GOWN STRL REUS W/ TWL LRG LVL3 (GOWN DISPOSABLE) ×2 IMPLANT
GOWN STRL REUS W/ TWL XL LVL3 (GOWN DISPOSABLE) ×4 IMPLANT
GOWN STRL REUS W/TWL LRG LVL3 (GOWN DISPOSABLE) ×2
GOWN STRL REUS W/TWL XL LVL3 (GOWN DISPOSABLE) ×4
GUIDEWIRE 1.35MM (WIRE) IMPLANT
IMPL TALAR ANKLE SZ 1 LT (Ankle) IMPLANT
IMPLANT TALAR ANKLE SZ 1 LT (Ankle) ×2 IMPLANT
INSERT TIB FB ANKLE SZ 1X7 LT (Ankle) IMPLANT
KIT BASIN OR (CUSTOM PROCEDURE TRAY) ×2 IMPLANT
KIT TURNOVER KIT B (KITS) ×2 IMPLANT
NS IRRIG 1000ML POUR BTL (IV SOLUTION) ×2 IMPLANT
PACK ORTHO EXTREMITY (CUSTOM PROCEDURE TRAY) ×2 IMPLANT
PAD ARMBOARD 7.5X6 YLW CONV (MISCELLANEOUS) ×4 IMPLANT
PAD CAST 4YDX4 CTTN HI CHSV (CAST SUPPLIES) ×6 IMPLANT
PADDING CAST COTTON 4X4 STRL (CAST SUPPLIES) ×2
PADDING CAST COTTON 6X4 STRL (CAST SUPPLIES) ×2 IMPLANT
PADDING CAST SYNTHETIC 4X4 STR (CAST SUPPLIES) IMPLANT
PIN POUCH TALAR VANTAGE 2.0 (PIN) IMPLANT
PIN POUCH TALAR VANTAGE 2.5 (PIN) IMPLANT
PIN POUCH TALAR VANTAGE 3.5 (PIN) IMPLANT
PLATE TIB FB ANKLE SZ 1 LT (Ankle) IMPLANT
POUCH SCREW TALAR ANKLE (ORTHOPEDIC DISPOSABLE SUPPLIES) IMPLANT
SAW STRYKER ANKLE 10X75X1.19 (BLADE) IMPLANT
SCREW QCKFIX CANN 4.0X40MM (Screw) IMPLANT
SPLINT PLASTER CAST FAST 5X30 (CAST SUPPLIES) IMPLANT
STOCKINETTE TUBULAR SYNTH 4IN (CAST SUPPLIES) IMPLANT
SUCTION FRAZIER HANDLE 10FR (MISCELLANEOUS)
SUCTION TUBE FRAZIER 10FR DISP (MISCELLANEOUS) IMPLANT
SURGILUBE 2OZ TUBE FLIPTOP (MISCELLANEOUS) ×2 IMPLANT
SUT ETHILON 3 0 PS 1 (SUTURE) ×4 IMPLANT
SUT MON AB 3-0 SH 27 (SUTURE) ×4
SUT MON AB 3-0 SH27 (SUTURE) ×4 IMPLANT
SUT VIC AB 0 CT1 27 (SUTURE) ×4
SUT VIC AB 0 CT1 27XBRD ANBCTR (SUTURE) ×4 IMPLANT
TOWEL GREEN STERILE (TOWEL DISPOSABLE) ×2 IMPLANT
TOWEL GREEN STERILE FF (TOWEL DISPOSABLE) ×2 IMPLANT
TUBE CONNECTING 12X1/4 (SUCTIONS) ×2 IMPLANT
YANKAUER SUCT BULB TIP NO VENT (SUCTIONS) ×2 IMPLANT

## 2022-11-11 NOTE — Anesthesia Procedure Notes (Addendum)
Anesthesia Regional Block: Popliteal block   Pre-Anesthetic Checklist: , timeout performed,  Correct Patient, Correct Site, Correct Laterality,  Correct Procedure, Correct Position, site marked,  Risks and benefits discussed,  Surgical consent,  Pre-op evaluation,  At surgeon's request and post-op pain management  Laterality: Lower and Left  Prep: chloraprep       Needles:  Injection technique: Single-shot  Needle Type: Stimiplex     Needle Length: 10cm  Needle Gauge: 21     Additional Needles:   Procedures:,,,, ultrasound used (permanent image in chart),,   Motor weakness within 5 minutes.  Narrative:  Start time: 11/11/2022 7:12 AM End time: 11/11/2022 7:20 AM Injection made incrementally with aspirations every 5 mL.  Performed by: Personally  Anesthesiologist: Nolon Nations, MD  Additional Notes: Nerve located and needle positioned with direct ultrasound guidance. Good perineural spread. Patient tolerated well.

## 2022-11-11 NOTE — Brief Op Note (Signed)
11/11/2022  9:53 AM  PATIENT:  Erika Luna  72 y.o. female  PRE-OPERATIVE DIAGNOSIS:  LEFT ANKLE OSTEOARTHRITIS, LIGAMENT INSTABILITY  POST-OPERATIVE DIAGNOSIS:  LEFT ANKLE OSTEOARTHRITIS, LIGAMENT INSTABILITY  PROCEDURE:  Left TAA  SURGEON:  Surgeon(s) and Role:    Erle Crocker, MD - Primary  PHYSICIAN ASSISTANT:  Jesse Martinique  ASSISTANTS: none   ANESTHESIA:   general  EBL: minimal  BLOOD ADMINISTERED:none  DRAINS: none   LOCAL MEDICATIONS USED:  NONE  SPECIMEN:  No Specimen  DISPOSITION OF SPECIMEN:  N/A  COUNTS:  YES  TOURNIQUET:  * Missing tourniquet times found for documented tourniquets in log: [id number] *  DICTATION: .Dragon Dictation  PLAN OF CARE: Discharge to home after PACU  PATIENT DISPOSITION:  PACU - hemodynamically stable.   Delay start of Pharmacological VTE agent (>24hrs) due to surgical blood loss or risk of bleeding: yes

## 2022-11-11 NOTE — Anesthesia Postprocedure Evaluation (Signed)
Anesthesia Post Note  Patient: Erika Luna  Procedure(s) Performed: LEFT TOTAL ANKLE ARTHROPLASTY (Left: Ankle) POSSIBLE LIGAMENT RECONSTRUCTION (Left)     Patient location during evaluation: PACU Anesthesia Type: General Level of consciousness: sedated and patient cooperative Pain management: pain level controlled Vital Signs Assessment: post-procedure vital signs reviewed and stable Respiratory status: spontaneous breathing Cardiovascular status: stable Anesthetic complications: no   No notable events documented.  Last Vitals:  Vitals:   11/11/22 1145 11/11/22 1210  BP: 100/62 (!) 123/95  Pulse: 78 82  Resp: 16 18  Temp: 36.7 C 36.7 C  SpO2: 93% 97%    Last Pain:  Vitals:   11/11/22 1130  PainSc: 0-No pain                 Nolon Nations

## 2022-11-11 NOTE — Anesthesia Procedure Notes (Addendum)
Anesthesia Regional Block: Adductor canal block   Pre-Anesthetic Checklist: , timeout performed,  Correct Patient, Correct Site, Correct Laterality,  Correct Procedure, Correct Position, site marked,  Risks and benefits discussed,  Surgical consent,  Pre-op evaluation,  At surgeon's request and post-op pain management  Laterality: Lower and Left  Prep: chloraprep       Needles:  Injection technique: Single-shot  Needle Type: Stimiplex     Needle Length: 9cm  Needle Gauge: 21     Additional Needles:   Procedures:,,,, ultrasound used (permanent image in chart),,    Narrative:  Start time: 11/11/2022 7:00 AM End time: 11/11/2022 7:12 AM Injection made incrementally with aspirations every 5 mL.  Performed by: Personally  Anesthesiologist: Nolon Nations, MD  Additional Notes: BP cuff, EKG monitors applied. Sedation begun. Artery and nerve location verified with ultrasound. Anesthetic injected incrementally (45m), slowly, and after negative aspirations under direct u/s guidance. Good fascial/perineural spread. Tolerated well.

## 2022-11-11 NOTE — H&P (Signed)
PREOPERATIVE H&P  Chief Complaint: Left ankle pain  HPI: Erika Luna is a 71 y.o. female who presents for preoperative history and physical with a diagnosis of left ankle osteoarthritis.  She has failed conservative treatment in the form of boot immobilization, anti-inflammatories, activity modification and injection and is indicated for surgery.  She is here today for total ankle arthroplasty. Symptoms are rated as moderate to severe, and have been worsening.  This is significantly impairing activities of daily living.  She has elected for surgical management.   Past Medical History:  Diagnosis Date   Anemia    Anxiety    Arthritis    Chronic headaches    Colon polyps    Complication of anesthesia    patient states she does not need much anesthesia, "cheap drunk",  slow to wake up from anesthesia   Depression    GERD (gastroesophageal reflux disease)    Hypercholesteremia    Hypertension    MRSA (methicillin resistant staph aureus) culture positive 10/28/2022   at PAT appt - positive MRSA and Staph, mupirocin called in to pharmacy to bid x 5 days prior to surgery.   UC (ulcerative colitis) ([place])    no current problems as of 10/28/22 per patient   Past Surgical History:  Procedure Laterality Date   ABDOMINAL HYSTERECTOMY     [place]     x 2   colonoscopy     ORIF HUMERUS FRACTURE Right 12/18/2020   Procedure: OPEN REDUCTION INTERNAL FIXATION (ORIF) PROXIMAL HUMERUS FRACTURE;  Surgeon: Tania Ade, MD;  Location: [facility];  Service: Orthopedics;  Laterality: Right;   SHOULDER SURGERY Right    shoulder surgery Right    to remove screws   surgery to remove scar tissue from endometriosis     TONSILLECTOMY     UPPER GI ENDOSCOPY  2023   Social History   Socioeconomic History   Marital status: Married    Spouse name: Not on file   Number of children: 2   Years of education: Not on file   Highest education level: Not on file   Occupational History   Occupation: retired  Tobacco Use   Smoking status: Never   Smokeless tobacco: Never  Vaping Use   Vaping Use: Never used  Substance and Sexual Activity   Alcohol use: No   Drug use: Never   Sexual activity: Yes    Birth control/protection: None, Post-menopausal  Other Topics Concern   Not on file  Social History Narrative   Not on file   Social Determinants of Health   Financial Resource Strain: Not on file  Food Insecurity: Not on file  Transportation Needs: Not on file  Physical Activity: Not on file  Stress: Not on file  Social Connections: Not on file   Family History  Problem Relation Age of Onset   Hypertension Mother    Hypertension Father    Heart disease Father    Hyperlipidemia Brother    Hypertension Brother    CAD Brother    Peripheral vascular disease Brother    Breast cancer Maternal Aunt    Hepatitis Maternal Aunt        from patient   Breast cancer Maternal Aunt    Prostate cancer Maternal Grandfather    Depression Daughter    Colon cancer Neg Hx    Esophageal cancer Neg Hx    Colon polyps Neg Hx    Pancreatic cancer Neg Hx  Stomach cancer Neg Hx    Allergies  Allergen Reactions   Azithromycin Nausea And Vomiting   Codeine Nausea And Vomiting   Lisinopril Rash   Prior to Admission medications   Medication Sig Start Date End Date Taking? Authorizing Provider  amLODipine (NORVASC) 10 MG tablet Take 10 mg by mouth daily.   Yes [provider]  aspirin 81 MG chewable tablet Chew 81 mg by mouth daily.   Yes [provider]  buPROPion (WELLBUTRIN XL) 300 MG 24 hr tablet Take 300 mg by mouth daily.   Yes [provider]  Calcium-Magnesium-Zinc (CAL-MAG-ZINC PO) Take 1 tablet by mouth every evening.   Yes [provider]  Cyanocobalamin (B-12 PO) Take 1 tablet by mouth daily.   Yes [provider]  escitalopram (LEXAPRO) 10 MG tablet Take 10 mg by mouth daily. 10/21/22  Yes  [provider]  esomeprazole (NEXIUM) 40 MG capsule Take 1 capsule (40 mg total) by mouth daily. 04/08/22  Yes Zehr, Laban Emperor, PA-C  famotidine (PEPCID) 20 MG tablet Take 1 tablet (20 mg total) by mouth at bedtime. 04/08/22  Yes Zehr, Laban Emperor, PA-C  ibuprofen (ADVIL) 200 MG tablet Take 600 mg by mouth every 6 (six) hours as needed for moderate pain.   Yes [provider]  irbesartan (AVAPRO) 75 MG tablet Take 75 mg by mouth daily.   Yes [provider]  metoprolol tartrate (LOPRESSOR) 25 MG tablet Take 12.5 mg by mouth at bedtime.   Yes [provider]  Olopatadine HCl (PATADAY OP) Place 1 drop into both eyes daily.   Yes [provider]  raloxifene (EVISTA) 60 MG tablet Take 60 mg by mouth every evening.   Yes [provider]  Red Yeast Rice Extract (RED YEAST RICE PO) Take 1 capsule by mouth every evening.   Yes [provider]  traZODone (DESYREL) 100 MG tablet Take 100 mg by mouth at bedtime.   Yes [provider]  VITAMIN D PO Take 1 capsule by mouth every evening.   Yes [provider]     Positive ROS: All other systems have been reviewed and were otherwise negative with the exception of those mentioned in the HPI and as above.  Physical Exam:  Vitals:   11/11/22 0553  BP: (!) 133/93  Pulse: 79  Resp: 18  SpO2: 95%   General: Alert, no acute distress Cardiovascular: No pedal edema Respiratory: No cyanosis, no use of accessory musculature GI: No organomegaly, abdomen is soft and non-tender Skin: No lesions in the area of chief complaint Neurologic: Sensation intact distally Psychiatric: Patient is competent for consent with normal mood and affect Lymphatic: No axillary or cervical lymphadenopathy  MUSCULOSKELETAL: Left ankle demonstrates tenderness to palpation on the anterior ankle joint.  Alignment clinically is well-maintained.  No gross instability with anterior drawer testing or talar tilt  testing.  Pain with range of motion.  Distally sensation grossly intact.  Foot is warm and well-perfused.  Assessment: Left ankle osteoarthritis   Plan: Plan for left total ankle arthroplasty with possible ligament reconstruction based on stability intraoperatively.  We discussed the risks, benefits and alternatives of surgery which include but are not limited to wound healing complications, infection, nonunion, malunion, need for further surgery, damage to surrounding structures and continued pain.  They understand there is no guarantees to an acceptable outcome.  After weighing these risks they opted to proceed with surgery.     Erle Crocker, MD  11/11/2022 7:11 AM

## 2022-11-11 NOTE — Transfer of Care (Signed)
Immediate Anesthesia Transfer of Care Note  Patient: Erika Luna  Procedure(s) Performed: LEFT TOTAL ANKLE ARTHROPLASTY (Left: Ankle) POSSIBLE LIGAMENT RECONSTRUCTION (Left)  Patient Location: PACU  Anesthesia Type:GA combined with regional for post-op pain  Level of Consciousness: drowsy and patient cooperative  Airway & Oxygen Therapy: Patient Spontanous Breathing  Post-op Assessment: Report given to RN and Post -op Vital signs reviewed and stable  Post vital signs: Reviewed and stable  Last Vitals:  Vitals Value Taken Time  BP 114/79 11/11/22 1030  Temp 36.6 C 11/11/22 1030  Pulse 79 11/11/22 1039  Resp 12 11/11/22 1039  SpO2 95 % 11/11/22 1039  Vitals shown include unvalidated device data.  Last Pain:  Vitals:   11/11/22 1030  PainSc: 0-No pain         Complications: No notable events documented.

## 2022-11-11 NOTE — Evaluation (Addendum)
Physical Therapy Evaluation Patient Details Name: Erika Luna MRN: [id number] DOB: 03-28-1951 Today's Date: 11/11/2022  History of Present Illness  71 y.o. female who presents 11/11/22 for left ankle replacement. PMH-anxiety, depression, HTN, ulcerative colitis  Clinical Impression  Pt admitted secondary to problem above with deficits below. PTA patient was independent with no device (although was pre-operatively educated on how to use crutches, RW and knee scooter and obtained all 3 for home use).  Pt currently requires up to mod assist for balance during transfers and gait. Pt reporting extreme dizziness "head spinning" when assisted up to bathroom and had to bring a chair to bathroom door to allow her to roll back across room to get back in bed. Too unsteady and dizzy for further gait or stair training (pt has 3 steps to enter home).  Anticipate patient will benefit from PT to address problems listed below. Will continue to follow acutely to maximize functional mobility independence and safety.    Discussed with RN pt's poor performance and do not recommend discharge home today.         Recommendations for follow up therapy are one component of a multi-disciplinary discharge planning process, led by the attending physician.  Recommendations may be updated based on patient status, additional functional criteria and insurance authorization.  Follow Up Recommendations No PT follow up      Assistance Recommended at Discharge Intermittent Supervision/Assistance  Patient can return home with the following  A little help with walking and/or transfers;Assistance with cooking/housework;Assist for transportation;Help with stairs or ramp for entrance    Equipment Recommendations None recommended by PT  Recommendations for Other Services       Functional Status Assessment Patient has had a recent decline in their functional status and demonstrates the ability to make significant  improvements in function in a reasonable and predictable amount of time.     Precautions / Restrictions Precautions Precautions: Fall Restrictions Weight Bearing Restrictions: Yes LLE Weight Bearing: Non weight bearing      Mobility  Bed Mobility Overal bed mobility: Modified Independent             General bed mobility comments: HOB elevated, no use of rail    Transfers Overall transfer level: Needs assistance Equipment used: Rolling walker (2 wheels), Crutches Transfers: Sit to/from Stand Sit to Stand: Min assist, Mod assist           General transfer comment: from EOB pt insisted on using crutches that she practiced with PTA; very unsteady and required mod assist to prevent fall; from toilet with RW and min assist    Ambulation/Gait Ambulation/Gait assistance: Min assist Gait Distance (Feet): 12 Feet (toileted, 5 ft to chair) Assistive device: Rolling walker (2 wheels), Crutches Gait Pattern/deviations: Step-to pattern       General Gait Details: pt became very dizzy when walking to bathroom "head spinning" and required assist to steady herself; vc for proper sequencing and use of crutches; pt agreed to use RW on return to chair and then rolled back to bedside with stand-pivot chair to bed  Stairs            Wheelchair Mobility    Modified Rankin (Stroke Patients Only)       Balance Overall balance assessment: Needs assistance Sitting-balance support: No upper extremity supported, Feet unsupported Sitting balance-Leahy Scale: Good     Standing balance support: Bilateral upper extremity supported, Reliant on assistive device for balance Standing balance-Leahy Scale: Poor Standing balance comment: very dizzy  Pertinent Vitals/Pain Pain Assessment Pain Assessment: Faces Faces Pain Scale: Hurts a little bit Pain Location: ankle Pain Descriptors / Indicators: Aching Pain Intervention(s): Limited activity  within patient's tolerance, Monitored during session, Premedicated before session    [place] expects to be discharged to:: Private residence Living Arrangements: Spouse/significant other Available Help at Discharge: Family;Available 24 hours/day Type of Home: House Home Access: Stairs to enter Entrance Stairs-Rails: Right Entrance Stairs-Number of Steps: 3   Home Layout: One level Home Equipment: Conservation officer, nature (2 wheels);Crutches;BSC/3in1;Shower seat (knee scooter) Additional Comments: has 3n1 over toilet    Prior Function Prior Level of Function : Independent/Modified Independent                     Hand Dominance        Extremity/Trunk Assessment   Upper Extremity Assessment Upper Extremity Assessment: Overall WFL for tasks assessed    Lower Extremity Assessment Lower Extremity Assessment: Overall WFL for tasks assessed (NWB LLE with short leg cast)    Cervical / Trunk Assessment Cervical / Trunk Assessment: Normal  Communication   Communication: No difficulties  Cognition Arousal/Alertness: Awake/alert Behavior During Therapy: WFL for tasks assessed/performed Overall Cognitive Status: Within Functional Limits for tasks assessed                                          General Comments General comments (skin integrity, edema, etc.): Pt reported she had PT session prior to surgery and they taught her how to use crutches, RW, and knee scooter. She preferred crutches as she was practicing PTA    Exercises     Assessment/Plan    PT Assessment Patient needs continued PT services  PT Problem List Decreased activity tolerance;Decreased balance;Decreased mobility;Decreased knowledge of use of DME;Decreased safety awareness       PT Treatment Interventions DME instruction;Gait training;Stair training;Functional mobility training;Therapeutic activities;Patient/family education    PT Goals (Current goals can be found in the  Care Plan section)  Acute Rehab PT Goals Patient Stated Goal: feel better and be able to walk PT Goal Formulation: With patient Time For Goal Achievement: 11/25/22 Potential to Achieve Goals: Good    Frequency Min 5X/week     Co-evaluation               AM-PAC PT "6 Clicks" Mobility  Outcome Measure Help needed turning from your back to your side while in a flat bed without using bedrails?: None Help needed moving from lying on your back to sitting on the side of a flat bed without using bedrails?: None Help needed moving to and from a bed to a chair (including a wheelchair)?: A Lot Help needed standing up from a chair using your arms (e.g., wheelchair or bedside chair)?: A Lot Help needed to walk in hospital room?: A Little Help needed climbing 3-5 steps with a railing? : Total 6 Click Score: 16    End of Session Equipment Utilized During Treatment: Gait belt Activity Tolerance: Treatment limited secondary to medical complications (Comment) (dizziness) Patient left: in bed;with call bell/phone within reach Nurse Communication: Mobility status PT Visit Diagnosis: Unsteadiness on feet (R26.81)    Time: 3875-6433 PT Time Calculation (min) (ACUTE ONLY): 28 min   Charges:   PT Evaluation $PT Eval Low Complexity: 1 Low PT Treatments $Gait Training: 8-22 mins         Arby Barrette,  PT Acute Rehabilitation Services  Office [phone]   Rexanne Mano 11/11/2022, 2:13 PM

## 2022-11-11 NOTE — Progress Notes (Signed)
Patient is non-weight bearing left lower extremity

## 2022-11-11 NOTE — Anesthesia Procedure Notes (Signed)
Procedure Name: Intubation Date/Time: 11/11/2022 7:35 AM  Performed by: Amadeo Garnet, CRNAPre-anesthesia Checklist: Patient identified, Emergency Drugs available, Suction available and Patient being monitored Patient Re-evaluated:Patient Re-evaluated prior to induction Oxygen Delivery Method: Circle system utilized Preoxygenation: Pre-oxygenation with 100% oxygen Induction Type: IV induction Ventilation: Mask ventilation without difficulty Laryngoscope Size: Mac and 3 Grade View: Grade I Tube type: Oral Tube size: 7.0 mm Number of attempts: 1 Airway Equipment and Method: Stylet and Oral airway Placement Confirmation: ETT inserted through vocal cords under direct vision, positive ETCO2 and breath sounds checked- equal and bilateral Secured at: 22 cm Tube secured with: Tape Dental Injury: Teeth and Oropharynx as per pre-operative assessment

## 2022-11-11 NOTE — Progress Notes (Signed)
OT Cancellation Note  Patient Details Name: ROXINE WHITTINGHILL MRN: [id number] DOB: December 27, 1950   Cancelled Treatment:    Reason Eval/Treat Not Completed: Other (comment) Noted imminent DC orders for OT. Discussed with PT who reports pt very dizzy and unsteady with evaluation this afternoon. Pt not discharging today so will hold OT attempts and follow up by tomorrow AM.  Layla Maw 11/11/2022, 2:16 PM

## 2022-11-12 DIAGNOSIS — M19072 Primary osteoarthritis, left ankle and foot: Secondary | ICD-10-CM | POA: Diagnosis not present

## 2022-11-12 NOTE — Progress Notes (Signed)
Physical Therapy Treatment Patient Details Name: Erika Luna MRN: [id number] DOB: 1951/03/01 Today's Date: 11/12/2022   History of Present Illness 71 y.o. female adm 11/11/22 for Lt ankle replacement. PMH-anxiety, depression, HTN, ulcerative colitis    PT Comments    Pt with limited progression with mobility and gait but continues to have HA and some dizziness limiting mobility. Pt able to perform HEP, transfers and limited gait without physical assist and anticipate will continue to progress well. Pt reports borrowed RW at home too big for her to manage and knee scooter is too painful on her hips. Will continue to follow acutely.     Recommendations for follow up therapy are one component of a multi-disciplinary discharge planning process, led by the attending physician.  Recommendations may be updated based on patient status, additional functional criteria and insurance authorization.  Follow Up Recommendations  No PT follow up     Assistance Recommended at Discharge Intermittent Supervision/Assistance  Patient can return home with the following A little help with walking and/or transfers;Assistance with cooking/housework;Assist for transportation;Help with stairs or ramp for entrance   Equipment Recommendations  Rolling walker (2 wheels) (youth)    Recommendations for Other Services       Precautions / Restrictions Precautions Precautions: Fall Restrictions Weight Bearing Restrictions: Yes LLE Weight Bearing: Non weight bearing     Mobility  Bed Mobility Overal bed mobility: Modified Independent             General bed mobility comments: flat bed no rail    Transfers Overall transfer level: Modified independent     Sit to Stand: Supervision           General transfer comment: cues for hand placement    Ambulation/Gait Ambulation/Gait assistance: Min guard Gait Distance (Feet): 35 Feet Assistive device: Rolling walker (2 wheels) Gait  Pattern/deviations: Step-to pattern   Gait velocity interpretation: 1.31 - 2.62 ft/sec, indicative of limited community ambulator   General Gait Details: pt with posterior HA and left eye pain limiting function. Pt with good adherence to NWB status and good control of RW   Stairs             Wheelchair Mobility    Modified Rankin (Stroke Patients Only)       Balance   Sitting-balance support: No upper extremity supported, Feet unsupported Sitting balance-Leahy Scale: Good     Standing balance support: Bilateral upper extremity supported, Reliant on assistive device for balance Standing balance-Leahy Scale: Poor Standing balance comment: reliant on RW due to NWB status                            Cognition Arousal/Alertness: Awake/alert Behavior During Therapy: WFL for tasks assessed/performed Overall Cognitive Status: Within Functional Limits for tasks assessed                                          Exercises General Exercises - Lower Extremity Long Arc Quad: AROM, Left, Seated, 10 reps Hip Flexion/Marching: AROM, Left, Seated, 10 reps    General Comments        Pertinent Vitals/Pain Pain Assessment Faces Pain Scale: Hurts a little bit Pain Location: left eye Pain Descriptors / Indicators: Aching Pain Intervention(s): Limited activity within patient's tolerance, Monitored during session    Home Living  Prior Function            PT Goals (current goals can now be found in the care plan section) Progress towards PT goals: Progressing toward goals    Frequency    Min 5X/week      PT Plan Current plan remains appropriate    Co-evaluation              AM-PAC PT "6 Clicks" Mobility   Outcome Measure  Help needed turning from your back to your side while in a flat bed without using bedrails?: None Help needed moving from lying on your back to sitting on the side of a flat  bed without using bedrails?: None Help needed moving to and from a bed to a chair (including a wheelchair)?: A Little Help needed standing up from a chair using your arms (e.g., wheelchair or bedside chair)?: A Little Help needed to walk in hospital room?: A Little Help needed climbing 3-5 steps with a railing? : A Lot 6 Click Score: 19    End of Session   Activity Tolerance: Patient tolerated treatment well Patient left: in chair;with call bell/phone within reach Nurse Communication: Mobility status PT Visit Diagnosis: Other abnormalities of gait and mobility (R26.89)     Time: 7225-7505 PT Time Calculation (min) (ACUTE ONLY): 19 min  Charges:  $Gait Training: 8-22 mins                     Bayard Males, PT Acute Rehabilitation Services Office: [place] Jenean Escandon 11/12/2022, 8:41 AM

## 2022-11-12 NOTE — Progress Notes (Signed)
     Erika Luna is a 71 y.o. female   Orthopaedic diagnosis: Left ankle osteoarthritis  Surgery: Left total ankle arthroplasty 11/11/2022  Subjective: Patient resting comfortably in bed this morning.  Nerve block still intact in the lower extremity.  She has no pain at this time.  She was unable to work with therapy yesterday as she was feeling a little dizzy recovering from anesthesia and surgery.  She does feel that this is resolving.  She is hopeful for discharge home but is concerned about pain management and ADLs.  She does have her husband at home as well as friends available to help if needed.  Objectyive: Vitals:   11/12/22 0002 11/12/22 0357  BP: 119/73 (!) 140/85  Pulse: 81 80  Resp: 16 20  Temp: 97.9 F (36.6 C) 98 F (36.7 C)  SpO2: 95% 98%     Exam: Awake and alert Respirations even and unlabored No acute distress  Examination left lower extremity demonstrates well-fitted clean, dry lower extremity splint.  Splint was not removed.  Mepilex dressing anterior tibia from Pin did have a small amount of blood.  This was removed.  The pin site was benign.  A new dressing was applied.  She has good range of motion at the knee.  She is unable to wiggle the toes due to intact nerve block.  She has a bit of tingling sensation to light touch of the toes.  The toes are warm and well-perfused distally.  Exposed skin is benign.  Assessment: Postop day 1 status post the above, doing well and suitable for discharge once cleared by physical therapy and pain controlled.   Plan: We had a long discussion this morning.  She is worried about pain management at home as she has no pain currently due to nerve block.  She is also worried about her ability to do ADLs and ambulate at home safely.  We discussed that she may benefit from SNF placement but we will see how she does with therapy today and will await their recommendations.  She may benefit from staying another day for pain  control and to continue working with therapy on ambulation and ADLs.  -Nonweightbearing left lower extremity with walker - Up with PT/OT today -Keep splint clean, dry, and intact -Pain control as needed -Lovenox for DVT prophylaxis while in house.  Plan for full-strength aspirin daily x 1 month for DVT prophylaxis outpatient.  -Plan for oxycodone for pain control outpatient.   Patient will follow-up with Dr. Lucia Gaskins 2 weeks from surgery at [facility] for splint removal, suture removal appropriate, x-rays, and likely placement of a nonweightbearing short leg cast.    Christoph Copelan J. [place], PA-C

## 2022-11-12 NOTE — Evaluation (Signed)
Occupational Therapy Evaluation Patient Details Name: Erika Luna MRN: [id number] DOB: Sep 13, 1951 Today's Date: 11/12/2022   History of Present Illness 71 y.o. female adm 11/11/22 for Lt ankle replacement. PMH-anxiety, depression, HTN, ulcerative colitis   Clinical Impression   PTA, pt was independent in ADL and IADL. Upn eval, pt requires supervision for LB ADL due to decreased balance with NWB status. Pt with good maintenance of precautions during session and use of compensatory techniques. Pt staying an additional night due to recurrent headache. Will follow acutely to address safety with LB dressing.     Recommendations for follow up therapy are one component of a multi-disciplinary discharge planning process, led by the attending physician.  Recommendations may be updated based on patient status, additional functional criteria and insurance authorization.   Follow Up Recommendations  No OT follow up     Assistance Recommended at Discharge Intermittent Supervision/Assistance  Patient can return home with the following A little help with walking and/or transfers;A little help with bathing/dressing/bathroom;Assistance with cooking/housework;Assist for transportation;Help with stairs or ramp for entrance    Functional Status Assessment  Patient has had a recent decline in their functional status and demonstrates the ability to make significant improvements in function in a reasonable and predictable amount of time.  Equipment Recommendations  None recommended by OT    Recommendations for Other Services       Precautions / Restrictions Precautions Precautions: Fall Restrictions Weight Bearing Restrictions: Yes LLE Weight Bearing: Non weight bearing      Mobility Bed Mobility               General bed mobility comments: in recliner chair on arrival and departure    Transfers Overall transfer level: Modified independent                         Balance   Sitting-balance support: No upper extremity supported, Feet unsupported Sitting balance-Leahy Scale: Good     Standing balance support: Bilateral upper extremity supported, Reliant on assistive device for balance Standing balance-Leahy Scale: Poor Standing balance comment: reliant on RW due to NWB status                           ADL either performed or assessed with clinical judgement   ADL Overall ADL's : Needs assistance/impaired Eating/Feeding: Independent   Grooming: Standing;Set up;Supervision/safety Grooming Details (indicate cue type and reason): educated regarding safety and encouraging to perform in seated position as needed esp for bil UE tasks Upper Body Bathing: Set up;Sitting   Lower Body Bathing: Set up;Sitting/lateral leans   Upper Body Dressing : Set up;Sitting   Lower Body Dressing: Sit to/from stand;Supervision/safety   Toilet Transfer: Supervision/safety;Ambulation;Rolling walker (2 wheels);BSC/3in1 Toilet Transfer Details (indicate cue type and reason): maintaining WB precautions Toileting- Clothing Manipulation and Hygiene: Set up;Sitting/lateral lean   Tub/ Shower Transfer: Tub transfer;Shower seat;Rolling walker (2 wheels);Grab bars;Min guard Tub/Shower Transfer Details (indicate cue type and reason): educated regarding safety with shower transfers as well as compensatory strategies. Pt verbalizing understanding. Demonstrating during session with min guard A for safety Functional mobility during ADLs: Supervision/safety;Rolling walker (2 wheels)       Vision Baseline Vision/History: 1 Wears glasses Ability to See in Adequate Light: 0 Adequate Patient Visual Report: No change from baseline Vision Assessment?: No apparent visual deficits     Perception Perception Perception Tested?: No   Praxis Praxis Praxis tested?: Not tested  Pertinent Vitals/Pain Pain Assessment Pain Assessment: Faces Faces Pain Scale: Hurts a little  bit Pain Location: left eye Pain Descriptors / Indicators: Aching Pain Intervention(s): Limited activity within patient's tolerance     Hand Dominance Left   Extremity/Trunk Assessment Upper Extremity Assessment Upper Extremity Assessment: Overall WFL for tasks assessed   Lower Extremity Assessment Lower Extremity Assessment: Defer to PT evaluation   Cervical / Trunk Assessment Cervical / Trunk Assessment: Normal   Communication Communication Communication: No difficulties   Cognition Arousal/Alertness: Awake/alert Behavior During Therapy: WFL for tasks assessed/performed Overall Cognitive Status: Within Functional Limits for tasks assessed                                       General Comments  Pt with good demo of compensatory techniques    Exercises     Shoulder Instructions      Home Living Family/patient expects to be discharged to:: Private residence Living Arrangements: Spouse/significant other Available Help at Discharge: Family;Available 24 hours/day Type of Home: House Home Access: Stairs to enter [employer] of Steps: 3 Entrance Stairs-Rails: Right Home Layout: One level     Bathroom Shower/Tub: Teacher, early years/pre: Handicapped height Bathroom Accessibility: Yes   Home Equipment: Conservation officer, nature (2 wheels);Crutches;BSC/3in1;Shower seat (knee scooter in which pt reports hurts her hip)   Additional Comments: has 3n1 over toilet      Prior Functioning/Environment Prior Level of Function : Independent/Modified Independent               ADLs Comments: Ind in ADL and IADL        OT Problem List: Decreased strength;Decreased activity tolerance;Impaired balance (sitting and/or standing);Decreased knowledge of use of DME or AE;Decreased knowledge of precautions      OT Treatment/Interventions: Self-care/ADL training;Therapeutic exercise;Therapeutic activities;Patient/family education;Balance training;DME  and/or AE instruction    OT Goals(Current goals can be found in the care plan section) Acute Rehab OT Goals Patient Stated Goal: get better with balance OT Goal Formulation: With patient Time For Goal Achievement: 11/26/22 Potential to Achieve Goals: Good  OT Frequency: Min 2X/week    Co-evaluation              AM-PAC OT "6 Clicks" Daily Activity     Outcome Measure Help from another person eating meals?: None Help from another person taking care of personal grooming?: A Little Help from another person toileting, which includes using toliet, bedpan, or urinal?: A Little Help from another person bathing (including washing, rinsing, drying)?: A Little Help from another person to put on and taking off regular upper body clothing?: None Help from another person to put on and taking off regular lower body clothing?: A Little 6 Click Score: 20   End of Session Equipment Utilized During Treatment: Gait belt;Rolling walker (2 wheels) Nurse Communication: Mobility status  Activity Tolerance: Patient tolerated treatment well Patient left: in chair;with call bell/phone within reach  OT Visit Diagnosis: Unsteadiness on feet (R26.81);Muscle weakness (generalized) (M62.81);Other abnormalities of gait and mobility (R26.89)                Time: 5397-6734 OT Time Calculation (min): 22 min Charges:  OT General Charges $OT Visit: 1 Visit OT Evaluation $OT Eval Low Complexity: 1 Low  Elder Cyphers, OTR/L [facility] Acute Rehabilitation Office: [phone]    Magnus Ivan 11/12/2022, 10:03 AM

## 2022-11-12 NOTE — Progress Notes (Signed)
     Erika Luna is a 71 y.o. female   Orthopaedic diagnosis: Left ankle osteoarthritis   Surgery: Left total ankle arthroplasty 11/11/2022  Subjective: Revisited patient this afternoon.  She is doing much better.  She worked with physical therapy and did walk with a walker.  She had some difficulty with stairs and she plans to work more on this with PT in the morning.  She is hopeful for discharge home thereafter. There has been discussion of home health services at discharge. she has questions about this.  Nerve block is beginning to wear off.  She is now able to wiggle the toes and notes some burning sensations lower extremity.  She has only taking 1 or 2 pain pills.  Objectyive: Vitals:   11/12/22 0805 11/12/22 1226  BP: 122/73 134/80  Pulse: 83 99  Resp: 16 16  Temp: 98.4 F (36.9 C) 98.4 F (36.9 C)  SpO2: 98% 99%    Exam: Awake and alert Respirations even and unlabored No acute distress   Examination left lower extremity demonstrates well-fitted clean, and dry lower extremity splint.  Splint was not removed.  Dressing intact over anterior tibia.  She has good range of motion at the knee.  She is now able to wiggle the toes.  She has a bit of tingling sensation to light touch of the toes.  The toes are warm and well-perfused distally.  Exposed skin is benign.   Assessment: Postop day 1 status post the above, doing well.  Plan for discharge home with home health therapy following clearance by physical therapy tomorrow morning.       Plan:  -Nonweightbearing left lower extremity with walker -Continue PT/OT. -Keep splint clean, dry, and intact -Pain control as needed -Lovenox for DVT prophylaxis while in house.  Plan for full-strength aspirin daily x 1 month for DVT prophylaxis outpatient.  -Plan for hydrocodone for pain control outpatient.   Patient will follow-up with Dr. Lucia Gaskins 2 weeks from surgery at [facility] for splint removal, suture removal  appropriate, x-rays, and likely placement of a nonweightbearing short leg cast.     Carlie Solorzano J. [place], PA-C

## 2022-11-12 NOTE — Progress Notes (Addendum)
Pt is refusing [facility] services per bedside RN. Pt did well with PT/OT evals and they did not recommend f/u.  1630: pt has decided to accept [facility] services. Choice provided and decision made for [facility] home health. Information on the AVS.

## 2022-11-13 DIAGNOSIS — M19072 Primary osteoarthritis, left ankle and foot: Secondary | ICD-10-CM | POA: Diagnosis not present

## 2022-11-13 MED ORDER — HYDROCODONE-ACETAMINOPHEN 5-325 MG PO TABS: ORAL_TABLET | ORAL | 0 refills | Status: DC

## 2022-11-13 MED ORDER — METHOCARBAMOL 750 MG PO TABS: ORAL_TABLET | ORAL | 0 refills | Status: DC

## 2022-11-13 MED ORDER — ASPIRIN 325 MG PO TABS: ORAL_TABLET | ORAL | 0 refills | Status: DC

## 2022-11-13 NOTE — Progress Notes (Signed)
Patient alert and oriented, ambulate, void. Surgical dressing dressing  clean  and dry. D/c instructions explain and given to the patient all questions answered. Patient d/c home per order.

## 2022-11-13 NOTE — Discharge Instructions (Signed)
DR. Lucia Gaskins FOOT & ANKLE SURGERY POST-OP INSTRUCTIONS   Pain Management The numbing medicine and your leg will last around 18 hours, take a dose of your pain medicine as soon as you feel it wearing off to avoid rebound pain. Keep your foot elevated above heart level.  Make sure that your heel hangs free ('floats'). Take all prescribed medication as directed. If taking narcotic pain medication you may want to use an over-the-counter stool softener to avoid constipation. You may take over-the-counter NSAIDs (ibuprofen, naproxen, etc.) as well as over-the-counter acetaminophen as directed on the packaging as a supplement for your pain and may also use it to wean away from the prescription medication.  Activity Non-weightbearing Keep splint intact  First Postoperative Visit Your first postop visit will be at least 2 weeks after surgery.  This should be scheduled when you schedule surgery. If you do not have a postoperative visit scheduled please call [phone] to schedule an appointment. At the appointment your incision will be evaluated for suture removal, x-rays will be obtained if necessary.  General Instructions Swelling is very common after foot and ankle surgery.  It often takes 3 months for the foot and ankle to begin to feel comfortable.  Some amount of swelling will persist for 6-12 months. DO NOT change the dressing.  If there is a problem with the dressing (too tight, loose, gets wet, etc.) please contact Dr. Pollie Friar office. DO NOT get the dressing wet.  For showers you can use an over-the-counter cast cover or wrap a washcloth around the top of your dressing and then cover it with a plastic bag and tape it to your leg. DO NOT soak the incision (no tubs, pools, bath, etc.) until you have approval from Dr. Lucia Gaskins.  Contact Dr. Huel Cote office or go to Emergency Room if: Temperature above 101 F. Increasing pain that is unresponsive to pain medication or elevation Excessive redness or  swelling in your foot Dressing problems - excessive bloody drainage, looseness or tightness, or if dressing gets wet Develop pain, swelling, warmth, or discoloration of your calf

## 2022-11-13 NOTE — Progress Notes (Signed)
Occupational Therapy Treatment Patient Details Name: Erika Luna MRN: [id number] DOB: [birth year] Today's Date: 11/13/2022   History of present illness 71 y.o. female adm 11/11/22 for Lt ankle replacement. PMH-anxiety, depression, HTN, ulcerative colitis   OT comments  Pt is making excellent progress with improved pain management this date. Overall she demonstrated great ability to maintain LLE NWB with RW during mobility and ADLs this session. She completed full body dressing, toileting and grooming with superivsion A for safety only. Pt is eager to d/c home. No follow up OT needs.    Recommendations for follow up therapy are one component of a multi-disciplinary discharge planning process, led by the attending physician.  Recommendations may be updated based on patient status, additional functional criteria and insurance authorization.    Follow Up Recommendations  No OT follow up     Assistance Recommended at Discharge Intermittent Supervision/Assistance  Patient can return home with the following  A little help with walking and/or transfers;A little help with bathing/dressing/bathroom;Assistance with cooking/housework;Assist for transportation;Help with stairs or ramp for entrance   Equipment Recommendations  None recommended by OT       Precautions / Restrictions Precautions Precautions: Fall Restrictions Weight Bearing Restrictions: Yes LLE Weight Bearing: Non weight bearing       Mobility Bed Mobility               General bed mobility comments: OOB upon arrival    Transfers Overall transfer level: Needs assistance Equipment used: Rolling walker (2 wheels) Transfers: Sit to/from Stand Sit to Stand: Supervision           General transfer comment: cues for hand placement     Balance Overall balance assessment: Needs assistance Sitting-balance support: No upper extremity supported, Feet unsupported Sitting balance-Leahy Scale: Good      Standing balance support: Single extremity supported, During functional activity Standing balance-Leahy Scale: Fair             ADL either performed or assessed with clinical judgement   ADL Overall ADL's : Needs assistance/impaired Eating/Feeding: Independent   Grooming: Supervision/safety;Standing Grooming Details (indicate cue type and reason): benefits from 1 UE supported externally         Upper Body Dressing : Set up;Sitting   Lower Body Dressing: Supervision/safety;Sit to/from stand Lower Body Dressing Details (indicate cue type and reason): educated LLE first Toilet Transfer: Supervision/safety;Rolling walker (2 wheels)   Toileting- Clothing Manipulation and Hygiene: Independent;Sitting/lateral lean       Functional mobility during ADLs: Supervision/safety;Rolling walker (2 wheels) General ADL Comments: great maintaince of LLE NWB    Extremity/Trunk Assessment Upper Extremity Assessment Upper Extremity Assessment: Overall WFL for tasks assessed   Lower Extremity Assessment Lower Extremity Assessment: Defer to PT evaluation        Vision   Vision Assessment?: No apparent visual deficits   Perception Perception Perception: Within Functional Limits   Praxis Praxis Praxis: Intact    Cognition Arousal/Alertness: Awake/alert Behavior During Therapy: WFL for tasks assessed/performed Overall Cognitive Status: Within Functional Limits for tasks assessed                          General Comments VSS on RA, pt reports she is not "swimmy headed" today    Pertinent Vitals/ Pain       Pain Assessment Pain Assessment: Faces Faces Pain Scale: Hurts little more Pain Location: LLE Pain Descriptors / Indicators: Discomfort Pain Intervention(s): Limited activity within patient's tolerance, Monitored during  session   Frequency  Min 2X/week        Progress Toward Goals  OT Goals(current goals can now be found in the care plan section)   Progress towards OT goals: Progressing toward goals  Acute Rehab OT Goals Patient Stated Goal: to go home OT Goal Formulation: With patient Time For Goal Achievement: 11/26/22 Potential to Achieve Goals: Good ADL Goals Pt Will Perform Lower Body Dressing: with modified independence;sit to/from stand Pt Will Transfer to Toilet: with modified independence;ambulating;regular height toilet  Plan Discharge plan remains appropriate       AM-PAC OT "6 Clicks" Daily Activity     Outcome Measure   Help from another person eating meals?: None Help from another person taking care of personal grooming?: A Little Help from another person toileting, which includes using toliet, bedpan, or urinal?: A Little Help from another person bathing (including washing, rinsing, drying)?: A Little Help from another person to put on and taking off regular upper body clothing?: None Help from another person to put on and taking off regular lower body clothing?: A Little 6 Click Score: 20    End of Session Equipment Utilized During Treatment: Gait belt;Rolling walker (2 wheels)  OT Visit Diagnosis: Unsteadiness on feet (R26.81);Muscle weakness (generalized) (M62.81);Other abnormalities of gait and mobility (R26.89)   Activity Tolerance Patient tolerated treatment well   Patient Left in chair;with call bell/phone within reach   Nurse Communication Mobility status        Time: 1696-7893 OT Time Calculation (min): 12 min  Charges: OT General Charges $OT Visit: 1 Visit OT Treatments $Self Care/Home Management : 8-22 mins    Elliot Cousin 11/13/2022, 9:12 AM

## 2022-11-13 NOTE — Discharge Summary (Cosign Needed)
Patient ID: Erika Luna MRN: [id number] DOB/AGE: [birth year] 71 y.o.  Admit date: 11/11/2022 Discharge date: 11/13/2022  Admission Diagnoses:  Principal Problem:   OA (osteoarthritis) Active Problems:   Arthritis of left ankle   Discharge Diagnoses:  Same  Past Medical History:  Diagnosis Date   Anemia    Anxiety    Arthritis    Chronic headaches    Colon polyps    Complication of anesthesia    patient states she does not need much anesthesia, "cheap drunk",  slow to wake up from anesthesia   Depression    GERD (gastroesophageal reflux disease)    Hypercholesteremia    Hypertension    MRSA (methicillin resistant staph aureus) culture positive 10/28/2022   at PAT appt - positive MRSA and Staph, mupirocin called in to pharmacy to bid x 5 days prior to surgery.   UC (ulcerative colitis) ([facility])    no current problems as of 10/28/22 per patient    Surgeries: Procedure(s): LEFT TOTAL ANKLE ARTHROPLASTY POSSIBLE LIGAMENT RECONSTRUCTION on 11/11/2022   Consultants:   Discharged Condition: Improved  Hospital Course: REITA SHINDLER is an 71 y.o. female who was admitted 11/11/2022 for operative treatment ofOA (osteoarthritis). Patient has severe unremitting pain that affects sleep, daily activities, and work/hobbies. After pre-op clearance the patient was taken to the operating room on 11/11/2022 and underwent  Procedure(s): LEFT TOTAL ANKLE ARTHROPLASTY POSSIBLE LIGAMENT RECONSTRUCTION.    Patient was given perioperative antibiotics:  Anti-infectives (From admission, onward)    Start     Dose/Rate Route Frequency Ordered Stop   11/12/22 0000  vancomycin (VANCOREADY) IVPB 750 mg/150 mL        750 mg 150 mL/hr over 60 Minutes Intravenous Every 12 hours 11/11/22 1220 11/12/22 0107   11/11/22 1045  vancomycin (VANCOCIN) IVPB 1000 mg/200 mL premix        1,000 mg 200 mL/hr over 60 Minutes Intravenous On call to O.R. 11/11/22 1034 11/11/22 1142   11/11/22 0615   ceFAZolin (ANCEF) IVPB 2g/100 mL premix        2 g 200 mL/hr over 30 Minutes Intravenous On call to O.R. 11/11/22 1102 11/11/22 0753        Patient was given sequential compression devices, early ambulation, and chemoprophylaxis to prevent DVT.  Patient benefited maximally from hospital stay and there were no complications.    Recent vital signs: Patient Vitals for the past 24 hrs:  BP Temp Temp src Pulse Resp SpO2  11/13/22 0734 123/78 98.4 F (36.9 C) Oral 79 20 99 %  11/13/22 0450 (!) 140/80 98 F (36.7 C) Oral 75 18 96 %  11/13/22 0046 122/75 99 F (37.2 C) Oral 85 16 93 %  11/12/22 1948 130/70 98.7 F (37.1 C) Oral 96 16 97 %  11/12/22 1605 (!) 147/89 99.3 F (37.4 C) Oral 87 16 96 %  11/12/22 1226 134/80 98.4 F (36.9 C) Oral 99 16 99 %     Recent laboratory studies:  Recent Labs    11/11/22 1336  WBC 13.1*  HGB 11.5*  HCT 36.2  PLT 281  CREATININE 1.12*     Discharge Medications:   Allergies as of 11/13/2022       Reactions   Azithromycin Nausea And Vomiting   Codeine Nausea And Vomiting   Lisinopril Rash        Medication List     STOP taking these medications    aspirin 81 MG chewable tablet Replaced by: aspirin 325 MG  tablet   neomycin-polymyxin-hydrocortisone OTIC solution Commonly known as: CORTISPORIN       TAKE these medications    amLODipine 10 MG tablet Commonly known as: NORVASC Take 10 mg by mouth daily.   aspirin 325 MG tablet Commonly known as: Bayer Aspirin Take one tablet by mouth daily for 30 days for blood clot prevention. May resume baseline 63m aspirin thereafter Replaces: aspirin 81 MG chewable tablet   B-12 PO Take 1 tablet by mouth daily.   buPROPion 300 MG 24 hr tablet Commonly known as: WELLBUTRIN XL Take 300 mg by mouth daily.   CAL-MAG-ZINC PO Take 1 tablet by mouth every evening.   escitalopram 10 MG tablet Commonly known as: LEXAPRO Take 10 mg by mouth daily.   esomeprazole 40 MG  capsule Commonly known as: NexIUM Take 1 capsule (40 mg total) by mouth daily.   famotidine 20 MG tablet Commonly known as: PEPCID Take 1 tablet (20 mg total) by mouth at bedtime.   HYDROcodone-acetaminophen 5-325 MG tablet Commonly known as: NORCO/VICODIN Take 1-2 tablets every 4-6 hours as needed for pain   ibuprofen 200 MG tablet Commonly known as: ADVIL Take 600 mg by mouth every 6 (six) hours as needed for moderate pain.   irbesartan 75 MG tablet Commonly known as: AVAPRO Take 75 mg by mouth daily.   methocarbamol 750 MG tablet Commonly known as: Robaxin-750 Take 1 tablet by mouth every 6-8 hours as needed for spasm   metoprolol tartrate 25 MG tablet Commonly known as: LOPRESSOR Take 12.5 mg by mouth at bedtime.   PATADAY OP Place 1 drop into both eyes daily.   raloxifene 60 MG tablet Commonly known as: EVISTA Take 60 mg by mouth every evening.   RED YEAST RICE PO Take 1 capsule by mouth every evening.   traZODone 100 MG tablet Commonly known as: DESYREL Take 100 mg by mouth at bedtime.   VITAMIN D PO Take 1 capsule by mouth every evening.               Durable Medical Equipment  (From admission, onward)           Start     Ordered   11/12/22 1757  For home use only DME Walker youth  Once       Question:  Patient needs a walker to treat with the following condition  Answer:  Unsteady gait when walking   11/12/22 1758              Discharge Care Instructions  (From admission, onward)           Start     Ordered   11/13/22 0000  Non weight bearing       Question Answer Comment  Laterality left   Extremity Lower      11/13/22 0839            Diagnostic Studies: DG Ankle Complete Left  Result Date: 11/11/2022 CLINICAL DATA:  LEFT ankle arthroplasty EXAM: LEFT ANKLE COMPLETE - 3+ VIEW COMPARISON:  01/26/2022 Fluoroscopy time: 2 minutes 24 seconds Dose: 4.24 mGy Images: 3 FINDINGS: Images demonstrate placement of a  prosthetic LEFT ankle joint. Cannulated screw across medial malleolus. Bones appear demineralized. No fracture, dislocation, or bone destruction. IMPRESSION: Post LEFT ankle arthroplasty without acute complication. Electronically Signed   By: MLavonia DanaM.D.   On: 11/11/2022 09:52   DG C-Arm 1-60 Min-No Report  Result Date: 11/11/2022 Fluoroscopy was utilized by the requesting physician.  No  radiographic interpretation.   DG C-Arm 1-60 Min-No Report  Result Date: 11/11/2022 Fluoroscopy was utilized by the requesting physician.  No radiographic interpretation.    Disposition: Discharge disposition: 01-Home or Self Care      Plan: -Nonweightbearing left lower extremity with walker -Complete PT today before discharge home. -Keep splint clean, dry, and intact -Norco for pain control, methocarbamol for spasms, aspirin for DVT prophylaxis outpatient.  Medications sent to pharmacy.     Patient will follow-up with Dr. Lucia Gaskins 2 weeks from surgery at [facility] for splint removal, suture removal appropriate, x-rays, and likely placement of a nonweightbearing short leg cast.   Discharge Instructions     Call MD / Call 911   Complete by: As directed    If you experience chest pain or shortness of breath, CALL 911 and be transported to the hospital emergency room.  If you develope a fever above 101 F, pus (white drainage) or increased drainage or redness at the wound, or calf pain, call your surgeon's office.   Constipation Prevention   Complete by: As directed    Drink plenty of fluids.  Prune juice may be helpful.  You may use a stool softener, such as Colace (over the counter) 100 mg twice a day.  Use MiraLax (over the counter) for constipation as needed.   Diet - low sodium heart healthy   Complete by: As directed    Non weight bearing   Complete by: As directed    Laterality: left   Extremity: Lower   Post-operative opioid taper instructions:   Complete by: As directed     POST-OPERATIVE OPIOID TAPER INSTRUCTIONS: It is important to wean off of your opioid medication as soon as possible. If you do not need pain medication after your surgery it is ok to stop day one. Opioids include: Codeine, Hydrocodone(Norco, Vicodin), Oxycodone(Percocet, oxycontin) and hydromorphone amongst others.  Long term and even short term use of opiods can cause: Increased pain response Dependence Constipation Depression Respiratory depression And more.  Withdrawal symptoms can include Flu like symptoms Nausea, vomiting And more Techniques to manage these symptoms Hydrate well Eat regular healthy meals Stay active Use relaxation techniques(deep breathing, meditating, yoga) Do Not substitute Alcohol to help with tapering If you have been on opioids for less than two weeks and do not have pain than it is ok to stop all together.  Plan to wean off of opioids This plan should start within one week post op of your joint replacement. Maintain the same interval or time between taking each dose and first decrease the dose.  Cut the total daily intake of opioids by one tablet each day Next start to increase the time between doses. The last dose that should be eliminated is the evening dose.           Follow-up Information     [facility] home health Follow up.   Why: The home health agency will contact you for the first home visit. Contact information: [phone]        Erle Crocker, MD Follow up in 2 week(s).   Specialty: Orthopedic Surgery Contact information: [place] [address] [phone]                  Signed: Malayla Granberry J [place] 11/13/2022, 8:44 AM

## 2022-11-13 NOTE — Progress Notes (Signed)
     Erika Luna is a 71 y.o. female   Orthopaedic diagnosis: Left ankle osteoarthritis   Surgery: Left total ankle arthroplasty 11/11/2022  Subjective: Patient doing well. She is hopeful for discharge home after working with physical therapy this morning.  Pain well-controlled on regimen of Norco and Robaxin.  She is eating and drinking well.  No new concerns.  No overnight events.  Objectyive: Vitals:   11/13/22 0450 11/13/22 0734  BP: (!) 140/80 123/78  Pulse: 75 79  Resp: 18 20  Temp: 98 F (36.7 C) 98.4 F (36.9 C)  SpO2: 96% 99%     Exam: Awake and alert Respirations even and unlabored No acute distress  Examination of left lower extremity demonstrates well-fitted, clean, and dry lower extremity splint.  The splint was not removed.  Dressing intact over the anterior tibia.  She has good range of motion at the knee.  She is able to wiggle the toes and endorses intact sensation to light touch about the toes.  The toes are warm and well-perfused.  Exposed skin is benign.  Calf is soft and nontender.  Assessment: Postop day 2 status post the above, doing well.  Plan for discharge home with home health therapy.   Plan: -Nonweightbearing left lower extremity with walker -Complete PT today before discharge home. -Keep splint clean, dry, and intact -Norco for pain control, methocarbamol for spasms, aspirin for DVT prophylaxis outpatient.  Medications sent to pharmacy.    Patient will follow-up with Dr. Lucia Gaskins 2 weeks from surgery at [facility] for splint removal, suture removal appropriate, x-rays, and likely placement of a nonweightbearing short leg cast.     Nysha Koplin J. [place], PA-C

## 2022-11-13 NOTE — Plan of Care (Signed)
  Problem: Education: ?Goal: Required Educational Video(s) ?Outcome: Completed/Met ?  ?Problem: Clinical Measurements: ?Goal: Postoperative complications will be avoided or minimized ?Outcome: Completed/Met ?  ?Problem: Skin Integrity: ?Goal: Demonstration of wound healing without infection will improve ?Outcome: Completed/Met ?  ?

## 2022-11-13 NOTE — Progress Notes (Signed)
Physical Therapy Treatment Patient Details Name: Erika Luna MRN: [id number] DOB: [birth year] Today's Date: 11/13/2022   History of Present Illness 71 y.o. female adm 11/11/22 for Lt ankle replacement. PMH-anxiety, depression, HTN, ulcerative colitis    PT Comments    Safely completed stair training this morning. Ambulating with RW comfortably, minor cues for improved safety and efficiency and responds well. Navigates 3 steps with posterior approach using RW. Handout provided, all questions answered. Adequate for d/c from mobility standpoint when medically ready. Pt reports adequate support from family at home as needed. Will follow and progress during admission.    Recommendations for follow up therapy are one component of a multi-disciplinary discharge planning process, led by the attending physician.  Recommendations may be updated based on patient status, additional functional criteria and insurance authorization.  Follow Up Recommendations  Follow physician's recommendations for discharge plan and follow up therapies     Assistance Recommended at Discharge Intermittent Supervision/Assistance  Patient can return home with the following A little help with walking and/or transfers;Assistance with cooking/housework;Assist for transportation;Help with stairs or ramp for entrance   Equipment Recommendations       Recommendations for Other Services       Precautions / Restrictions Precautions Precautions: Fall Restrictions Weight Bearing Restrictions: Yes LLE Weight Bearing: Non weight bearing     Mobility  Bed Mobility               General bed mobility comments: OOB upon arrival    Transfers Overall transfer level: Modified independent                 General transfer comment: No physical assist needed with RW from recliner.    Ambulation/Gait Ambulation/Gait assistance: Supervision Gait Distance (Feet): 50 Feet (x2) Assistive device: Rolling  walker (2 wheels) Gait Pattern/deviations:  (Hop) Gait velocity: dec Gait velocity interpretation: <1.31 ft/sec, indicative of household ambulator   General Gait Details: Maintains NWB precautions. Educated on walker placement and appropriate use of RW for support and efficency. No LOB, cues to prevent getting too close to front of RW with step and responded well.   Stairs Stairs: Yes Stairs assistance: Min assist Stair Management: One rail Right, Step to pattern, Backwards, Forwards, With walker, With crutches Number of Stairs: 3 (x3) General stair comments: Attempted technique pt used previously with single crutch and rail - not able to perform this technique safely due to lack of adequate UE strength. Taught posterior approach with RW bil hands on AD. Min assist to block RW. Completed 2 bouts and felt more comfortable on second trial. Handout provided. Cues for technique with hand placement posterior portion of hand grips. Family will stabilize RW.   Wheelchair Mobility    Modified Rankin (Stroke Patients Only)       Balance Overall balance assessment: Needs assistance Sitting-balance support: No upper extremity supported, Feet unsupported Sitting balance-Leahy Scale: Good     Standing balance support: Single extremity supported, During functional activity Standing balance-Leahy Scale: Poor                              Cognition Arousal/Alertness: Awake/alert Behavior During Therapy: WFL for tasks assessed/performed Overall Cognitive Status: Within Functional Limits for tasks assessed  Exercises      General Comments General comments (skin integrity, edema, etc.): VSS on RA, pt reports she is not "swimmy headed" today      Pertinent Vitals/Pain Pain Assessment Pain Assessment: Faces Faces Pain Scale: Hurts little more Pain Location: LLE Pain Descriptors / Indicators: Discomfort Pain  Intervention(s): Limited activity within patient's tolerance, Monitored during session, Repositioned    Home Living                          Prior Function            PT Goals (current goals can now be found in the care plan section) Acute Rehab PT Goals Patient Stated Goal: get well PT Goal Formulation: With patient Time For Goal Achievement: 11/25/22 Potential to Achieve Goals: Good Progress towards PT goals: Progressing toward goals    Frequency    Min 5X/week      PT Plan Current plan remains appropriate    Co-evaluation              AM-PAC PT "6 Clicks" Mobility   Outcome Measure  Help needed turning from your back to your side while in a flat bed without using bedrails?: None Help needed moving from lying on your back to sitting on the side of a flat bed without using bedrails?: None Help needed moving to and from a bed to a chair (including a wheelchair)?: A Little Help needed standing up from a chair using your arms (e.g., wheelchair or bedside chair)?: A Little Help needed to walk in hospital room?: A Little Help needed climbing 3-5 steps with a railing? : A Lot 6 Click Score: 19    End of Session Equipment Utilized During Treatment: Gait belt Activity Tolerance: Patient tolerated treatment well Patient left: in chair;with call bell/phone within reach Nurse Communication: Mobility status PT Visit Diagnosis: Other abnormalities of gait and mobility (R26.89)     Time: 1157-2620 PT Time Calculation (min) (ACUTE ONLY): 19 min  Charges:  $Gait Training: 8-22 mins                     Candie Mile, PT, DPT Physical Therapist Acute Rehabilitation Services [facility] 11/13/2022, 10:14 AM

## 2022-11-15 ENCOUNTER — Encounter (HOSPITAL_COMMUNITY): Payer: Self-pay | Admitting: Orthopaedic Surgery

## 2022-11-17 NOTE — Op Note (Signed)
Erika Luna female 72 y.o. 11/11/2022  PreOperative Diagnosis: Left ankle osteoarthritis  PostOperative Diagnosis: same  PROCEDURE: Left total ankle arthroplasty  SURGEON: Melony Overly, MD  ASSISTANT: see [place], PA-C was necessary for patient positioning, prep, drape, assistance with Jade placement placement of implants and closure.  ANESTHESIA: General with peripheral nerve blockade  FINDINGS: Left ankle osteoarthritis, end-stage with varus tilt  IMPLANTS: Advantage total ankle arthroplasty size 1 fixed tibial plate, size 1 talar implant, size 1 7 mm fixed bearing  INDICATIONS:71 y.o. femalehad radiographic and clinical findings consistent with ankle osteoarthritis.  We discussed conservative treatment which she failed ultimately in the form of injection, activity modification, immobilization and requested definitive treatment in the form of total ankle arthroplasty.   Patient understood the risks, benefits and alternatives to surgery which include but are not limited to wound healing complications, infection, nonunion, malunion, need for further surgery as well as damage to surrounding structures. They also understood the potential for continued pain in that there were no guarantees of acceptable outcome After weighing these risks the patient opted to proceed with surgery.  PROCEDURE: Patient was identified in the preoperative holding area.  The left ankle was marked by myself.  Consent was signed by myself and the patient.  Block was performed by anesthesia in the preoperative holding area.  Patient was taken to the operative suite and placed supine on the operative table.  General anesthesia was induced without difficulty. Bump was placed under the operative hip and bone foam was used.  All bony prominences were well padded.  Tourniquet was placed on the operative thigh.  Preoperative antibiotics were given. The extremity was prepped and draped in the usual sterile  fashion and surgical timeout was performed.  The limb was elevated and the tourniquet was inflated to 250 mmHg.  We began by making a longitudinal incision overlying the anterior aspect of the ankle for an anterior ankle approach.  The incision was carried sharply down through skin and subcutaneous tissue in the interval between the tibialis anterior and extensor hallucis longus tendons were identified.  The retinaculum was incised.  The tibialis anterior and extensor hallucis longus tendons and sheath were mobilized.  The neurovascular bundle was elevated off of the anterior tibia and retracted and protected through the entire the case.  The incision was then carried deep through the ankle capsule.  The capsule was elevated off the anterior aspect of the ankle and the ankle was fully exposed.  There is significant arthrosis within the ankle with anterior osteophytes.  Some of the smaller osteophytes were removed with a rongeur and an osteotome to gain access to the ankle joint.  Once the ankle joint was fully exposed a small incision was made overlying the tibial tubercle and the pin was placed within the tibial tubercle in line with the second ray and lined up with the foot in a neutral dorsiflexed position.  Then the cutting jig was placed.  It was placed in line with the tibial crest.  It was noted that there was relatively significant amount of external rotation through the tibia as we came down to the foot.  With the knee cap pointed directly anterior the foot was quite externally rotated.  Then the blade was placed within the medial gutter with the foot held in a neutral dorsiflexed position.  Then the rotation was confirmed and the cutting jig was pinned proximally.  Then lateral fluoroscopy was used to check for the amount of tibial resection.  This was confirmed fluoroscopically and the jig was once again pinned in place.  Then anteriorly fluoroscopy was used to check varus valgus as well as  translation laterally medially.  This was confirmed on fluoroscopy and the jig was finally pinned in place.  Then the tibial cut was made.  During resection of the cut portion of the tibia there was a small fracture of the anterior colliculus of the medial malleolus.  There was no displacement of this fragment and it was stabilized using a 4.0 mm cannulated screw. It was noted that the patient had very soft bone throughout the procedure.  Then the tibial guide was removed and the talar cutting guide was placed.  Appropriate resection of the talus was performed with good tension.  Then a lollipop was placed to confirm appropriate resection.  There was good stability of the medial and lateral ligamentous structures and the ankle was well aligned.  Then the size of the tibia was confirmed.  Then the talus gutting jig was placed and confirmed fluoroscopically to be adequately positioned.  The talar cuts were made.  Then the rasp was used to smooth out the cuts.  Then the trial implants were placed and found to be acceptable. The talar peg pins were drilled.  Then the guide was placed for the tibial pegs.  The pegs were punched.  Then implants were selected and the tibial implant was placed with good impaction and good bony contact confirmed fluoroscopically.  Then the talar implant was placed.  We chose a size 1 of both.  Then a size 7 Polly was ultimately chosen due to stability and available range of motion.  During the placement of implants the medial malleolar fracture did not move and appeared to be nearly anatomically reduced and stable.  Then the wound was irrigated copiously with normal saline.  Final fluoroscopic images were obtained.  The ankle was taken through a range of motion were able to gain slightly more than neutral dorsiflexion.  This was done gently.  The ankle was stable.  The wound was then closed in a layered fashion using Vicryl, Monocryl and nylon suture.  Then the tourniquet was  released.  She was placed in a short leg nonweightbearing splint.   POST OPERATIVE INSTRUCTIONS: Nonweightbearing to operative extremity She will be discharged postoperative day 1 if she is able to mobilize. DVT prophylaxis until weightbearing She will follow-up in 2 weeks for removal, suture removal and nonweightbearing x-rays. Likely to begin weightbearing 4 weeks.  She will go into a short leg cast and then follow up 2-3 weeks later for transition to a boot and advancement of weightbearing.   TOURNIQUET TIME:Less than 2 hours  BLOOD LOSS:  less than 50 mL         DRAINS: none         SPECIMEN: none       COMPLICATIONS:  during resection of the tibial cut piece there was an anterior colliculus avulsion fracture.  This was stabilized using a 4.0 mm cannulated screw in a standard fashion.  There was no significant displacement of the fracture.  The patient was noted to have significantly soft bone throughout the procedure.         Disposition: PACU - hemodynamically stable.         Condition: stable

## 2023-09-27 LAB — HM MAMMOGRAPHY

## 2023-10-06 IMAGING — CT CT ANKLE*L* W/O CM
2 series · 13 of 27 positions shown, 16 images · non-contrast
Comparison: None.

CLINICAL DATA: Chronic ankle pain.

EXAM:
CT OF THE LEFT ANKLE WITHOUT CONTRAST
TECHNIQUE: Multidetector CT imaging of the left ankle was performed according
to the standard protocol. Multiplanar CT image reconstructions were
also generated.
RADIATION DOSE REDUCTION: This exam was performed according to the
departmental dose-optimization program which includes automated
exposure control, adjustment of the mA and/or kV according to
patient size and/or use of iterative reconstruction technique.

[Series 5: soft tissue lower extremity · axial · 0.32mm/px · z∈[-281,-105]mm · 8 of 104 slices shown, 10 images]
[im 8/104  soft-tissue]
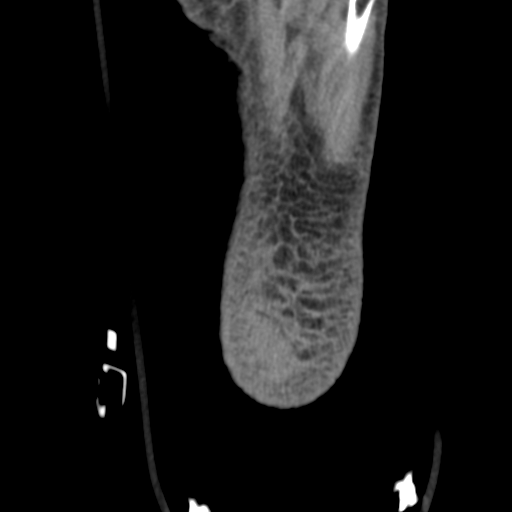
[im 8/104  bone]
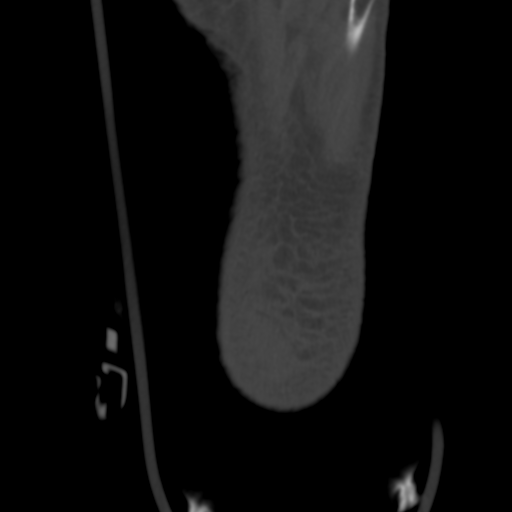
[im 24/104  bone]
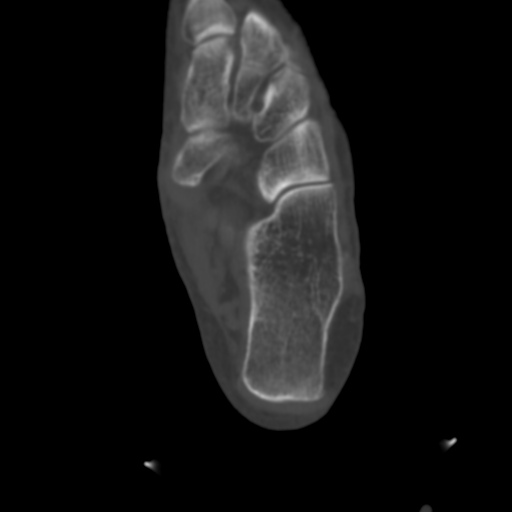
[im 32/104  bone]
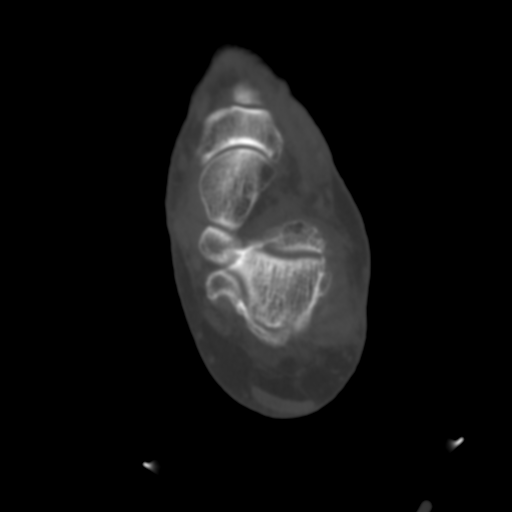
[im 48/104  bone]
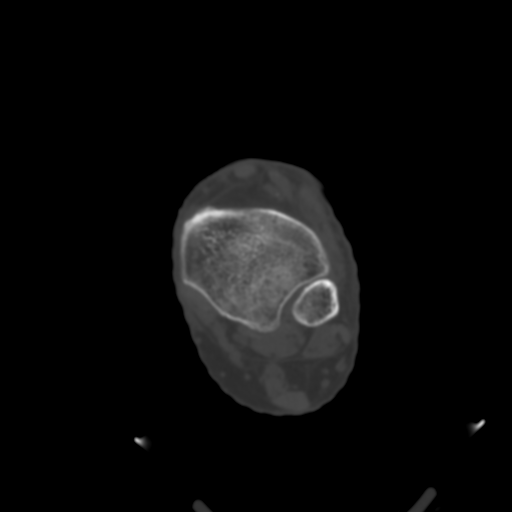
[im 56/104  soft-tissue]
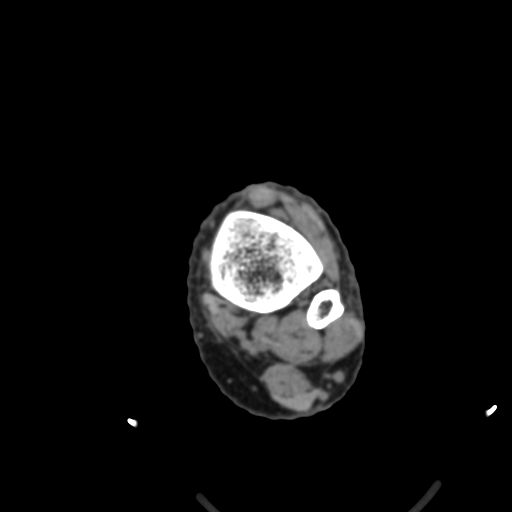
[im 56/104  bone]
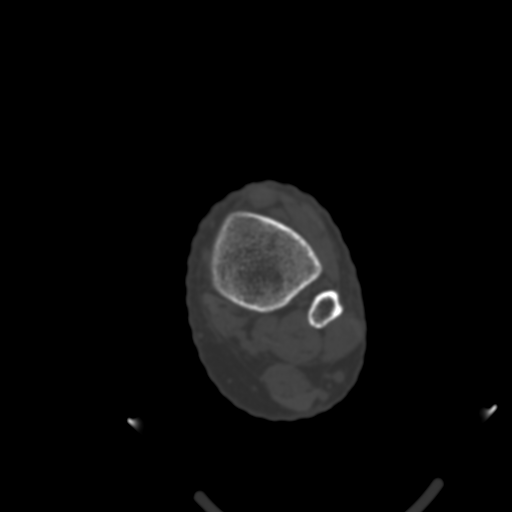
[im 72/104  bone]
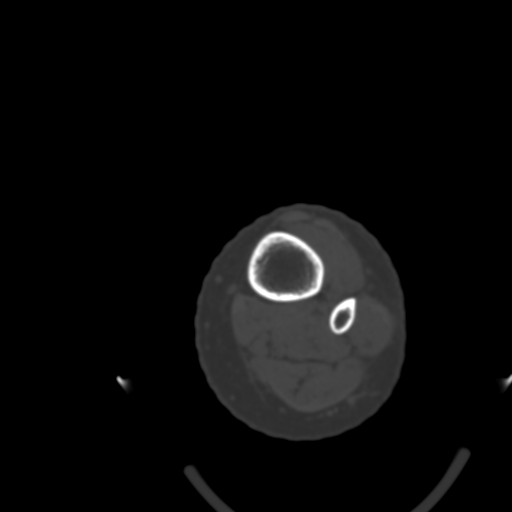
[im 80/104  bone]
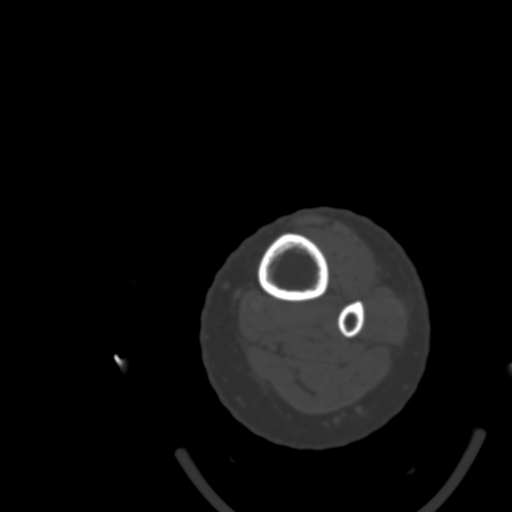
[im 96/104  bone]
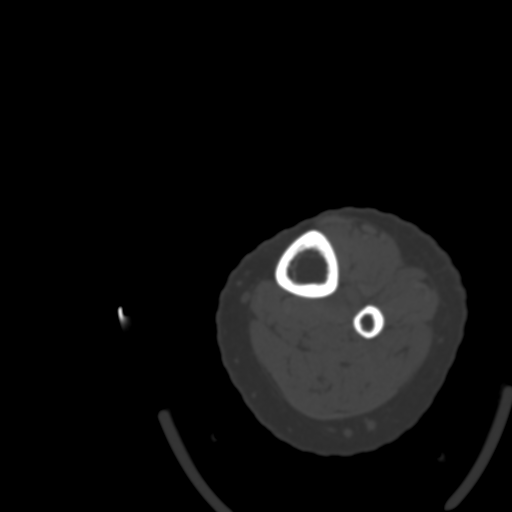

[Series 11: sagsoft tissue · sagittal · 0.27mm/px · 5 of 53 slices shown, 6 images]
[im 18/53  bone]
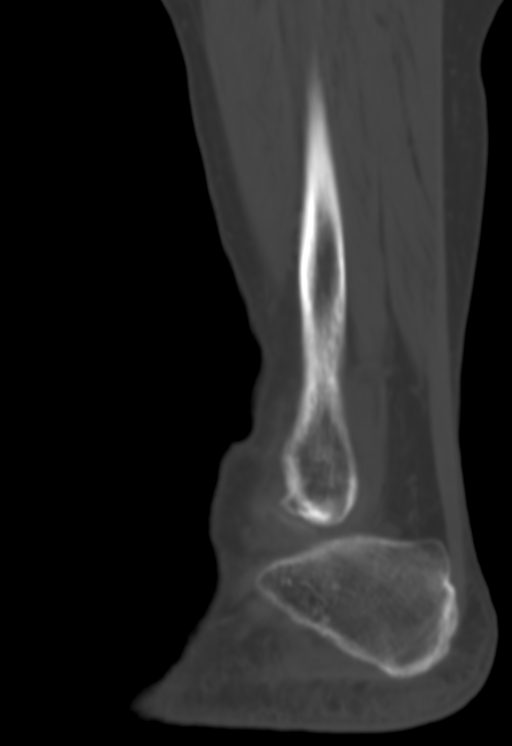
[im 22/53  bone]
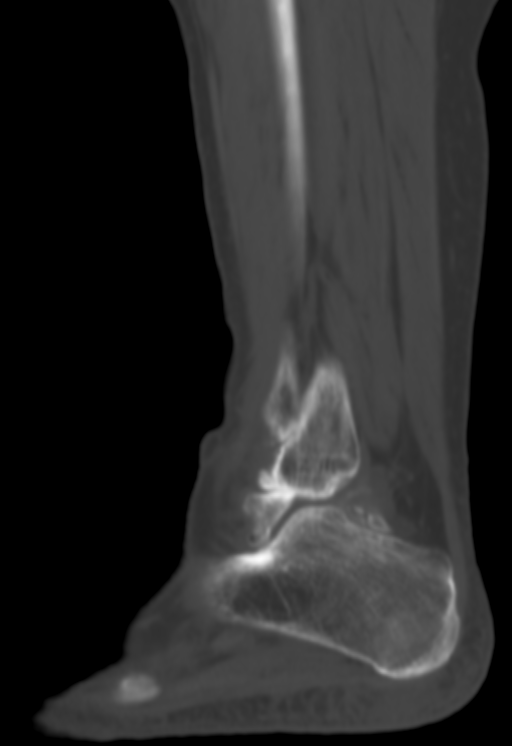
[im 27/53  soft-tissue]
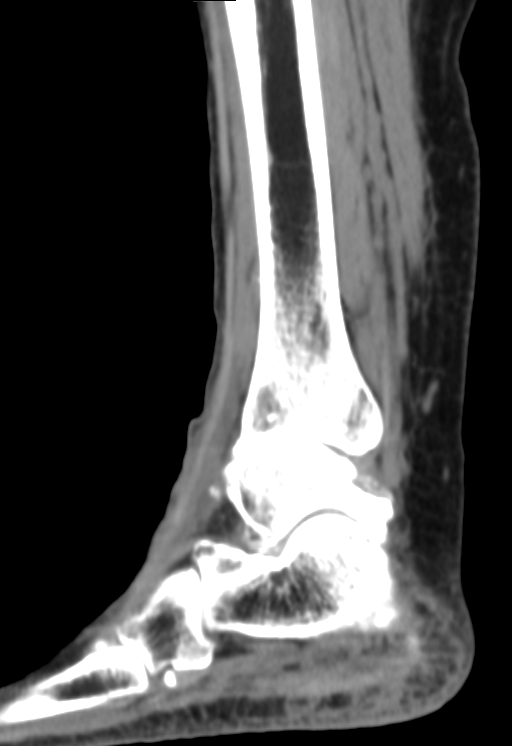
[im 27/53  bone]
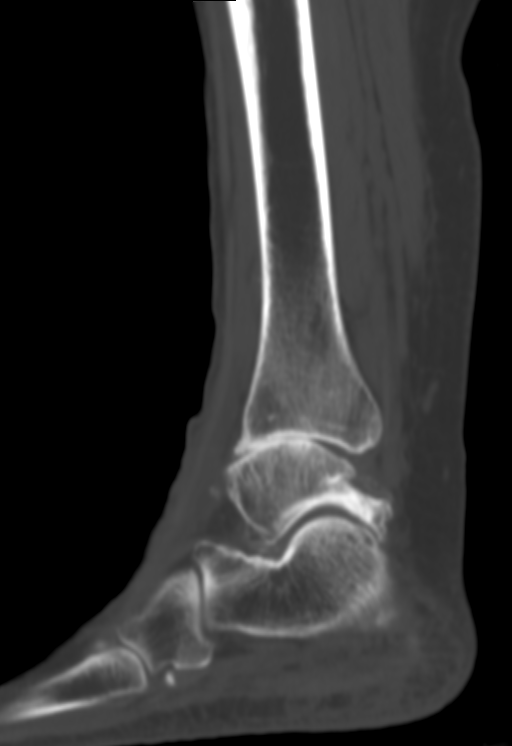
[im 31/53  bone]
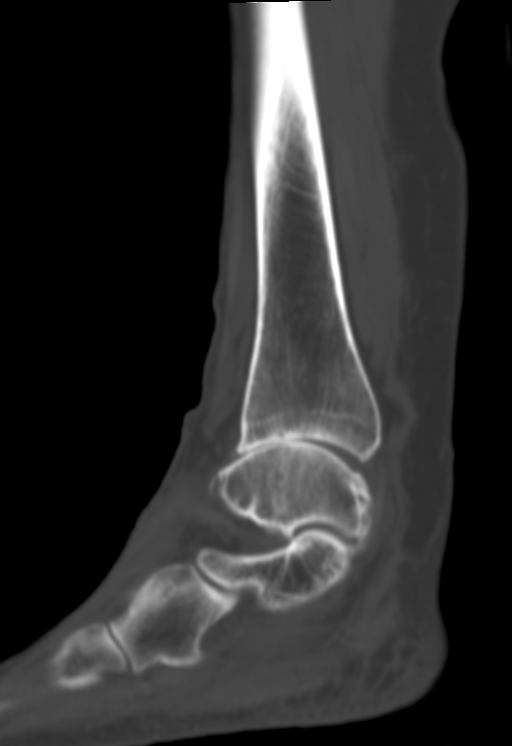
[im 35/53  bone]
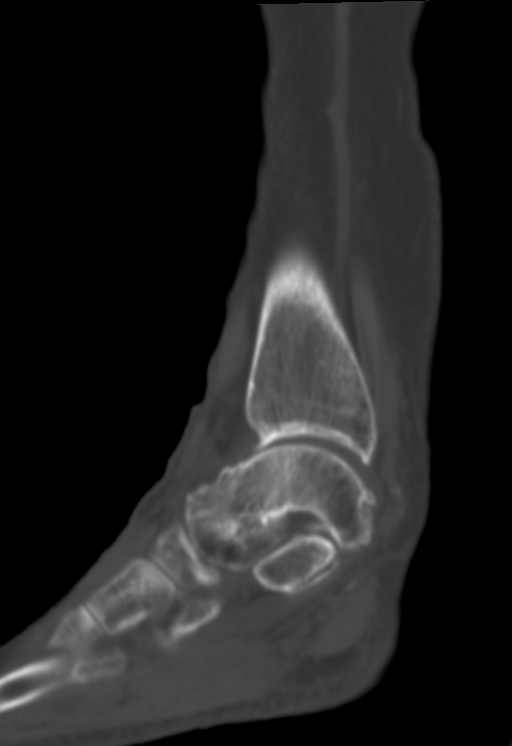

[13 of 27 positions shown; findings below may reference images not displayed]

FINDINGS: Advanced tibiotalar joint degenerative changes with joint space
narrowing mainly medially with bone-on-bone appearance. No obvious
joint effusion. Areas of chondrocalcinosis are noted. There are
moderate subtalar joint degenerative changes. The visualized midfoot
joint spaces are maintained.

No acute bony findings or evidence of stress fracture or AVN. Type 2
os navicular noted.

Grossly by CT the major tendons and ligaments appear intact.
IMPRESSION: 1. Advanced tibiotalar joint degenerative changes with bone-on-bone
appearance and areas of chondrocalcinosis.
2. Moderate subtalar joint degenerative changes.
3. No acute bony findings or evidence of stress fracture or AVN.

## 2023-12-25 IMAGING — US US ABDOMEN LIMITED
1 series · 15 of 25 positions shown · non-contrast
Comparison: None Available.

CLINICAL DATA: Right upper quadrant abdominal pain.

EXAM:
ULTRASOUND ABDOMEN LIMITED RIGHT UPPER QUADRANT

[Series 1: us abdomen limited ruq mc & wl · 15 of 39 slices shown]
[im 1/39]
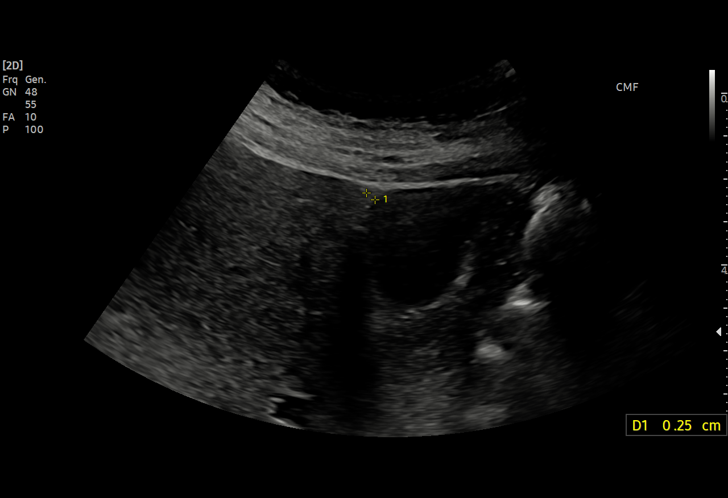
[im 4/39]
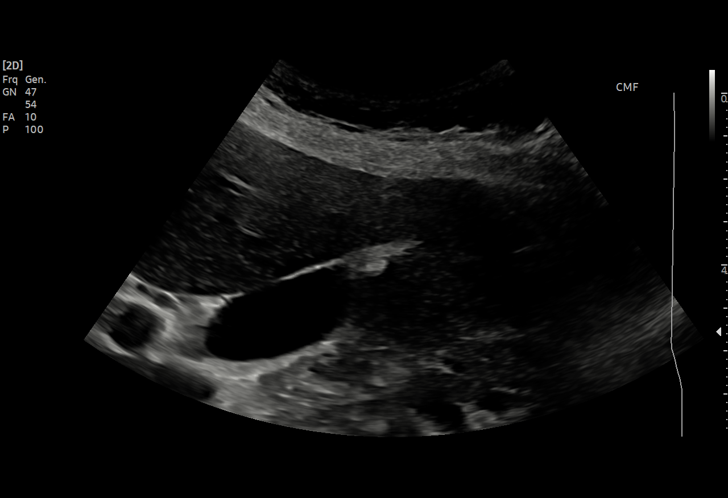
[im 7/39]
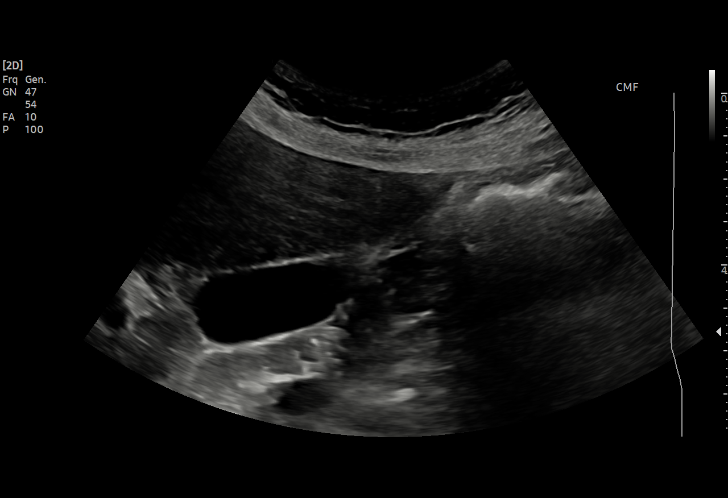
[im 8/39]
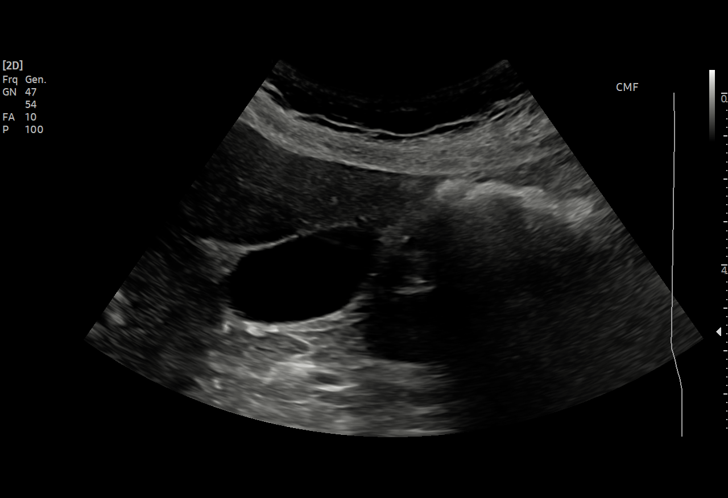
[im 12/39]
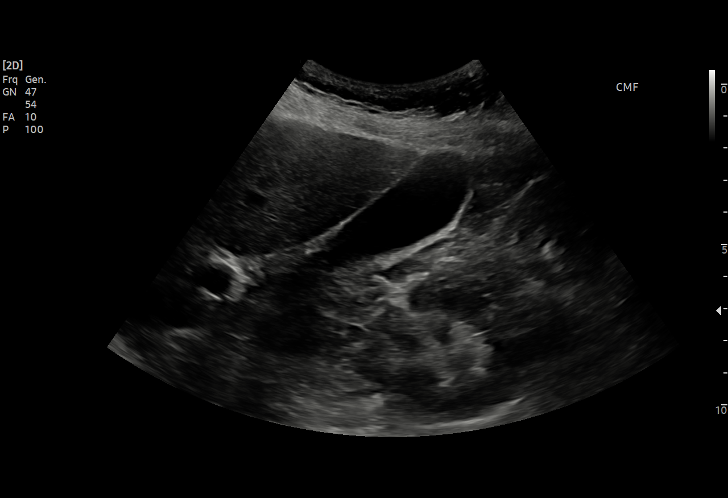
[im 15/39]
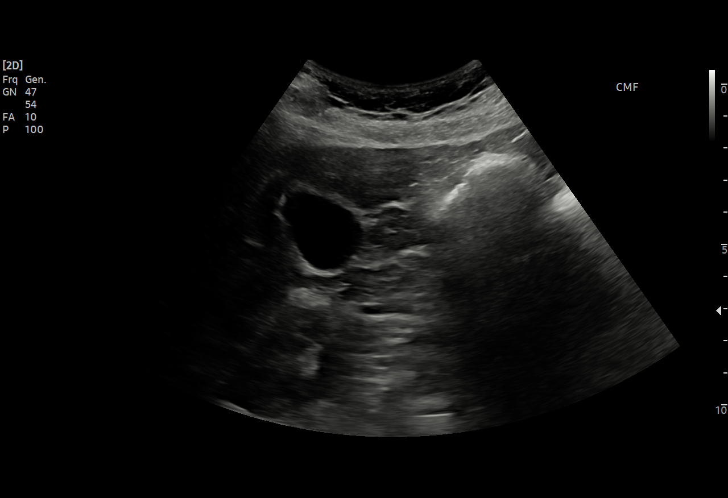
[im 16/39]
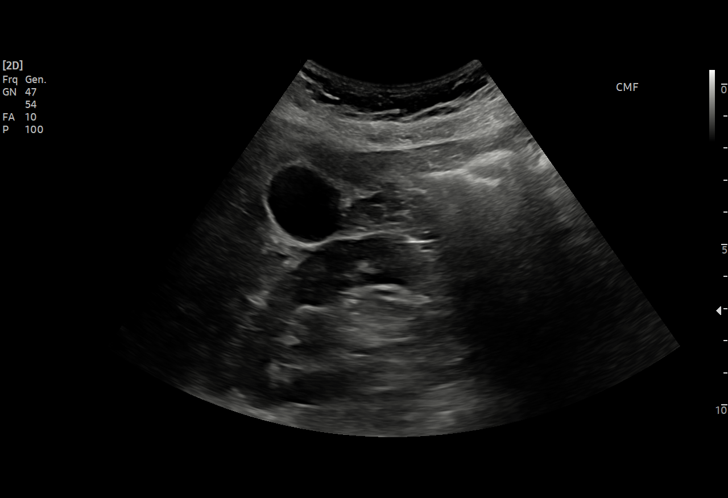
[im 20/39]
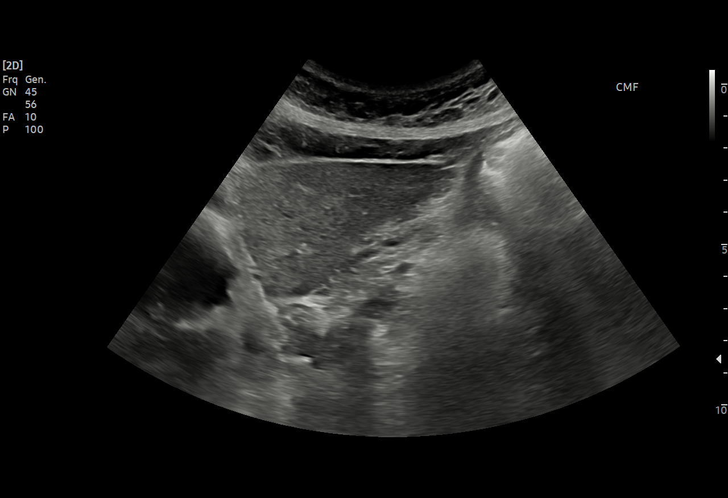
[im 23/39]
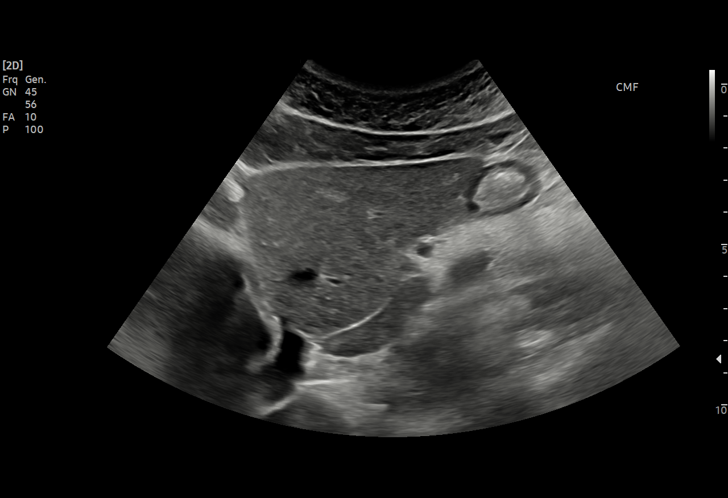
[im 24/39]
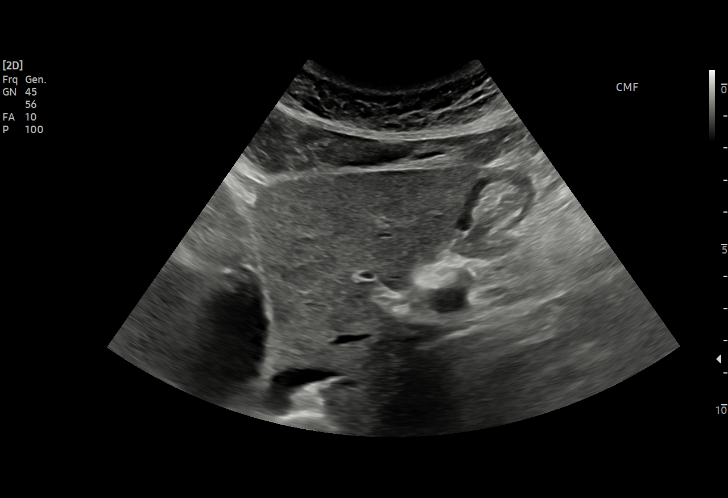
[im 27/39]
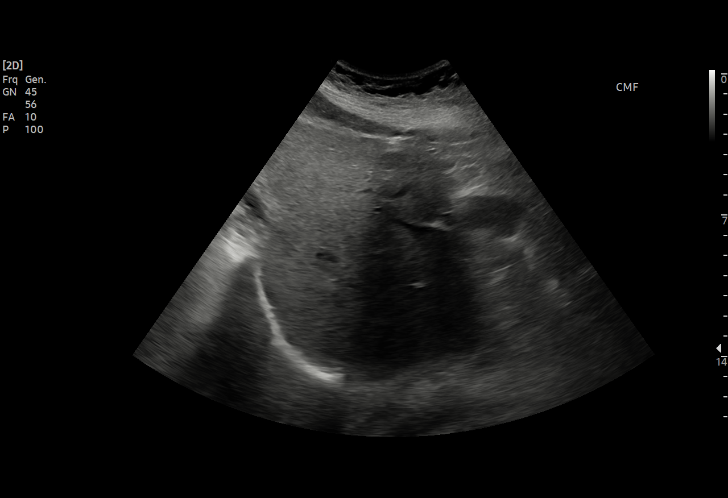
[im 31/39]
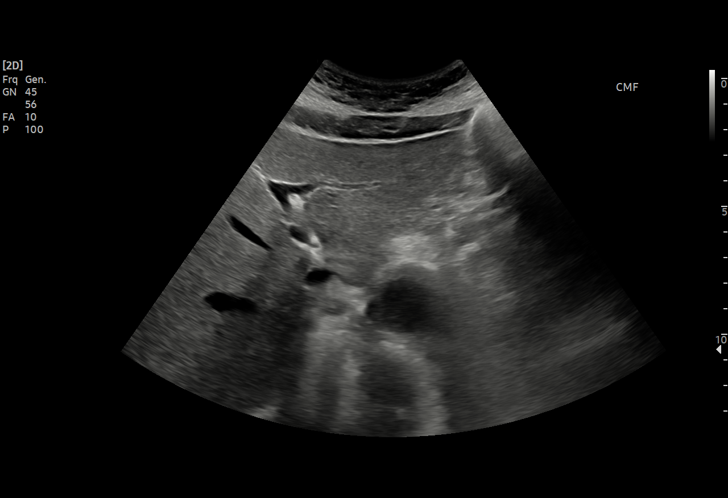
[im 32/39]
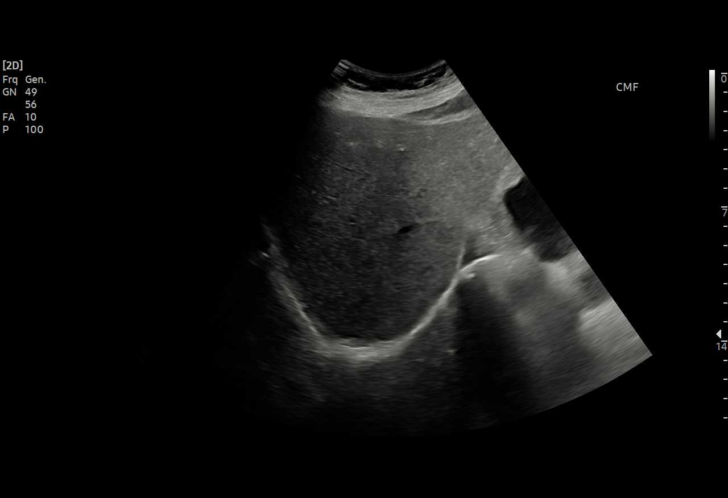
[im 35/39]
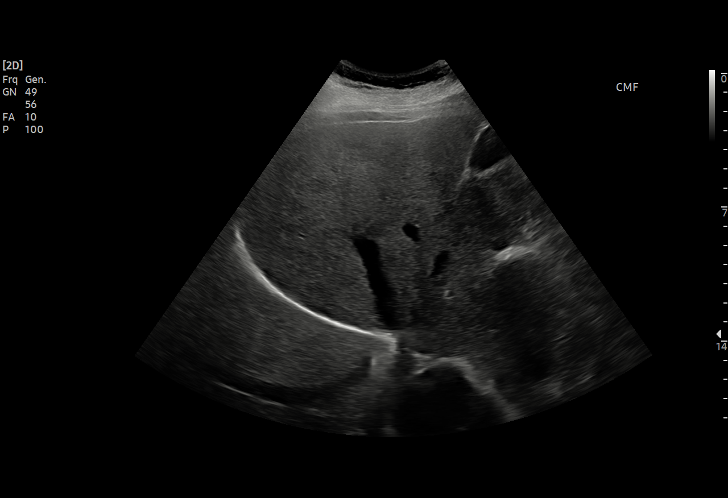
[im 39/39]
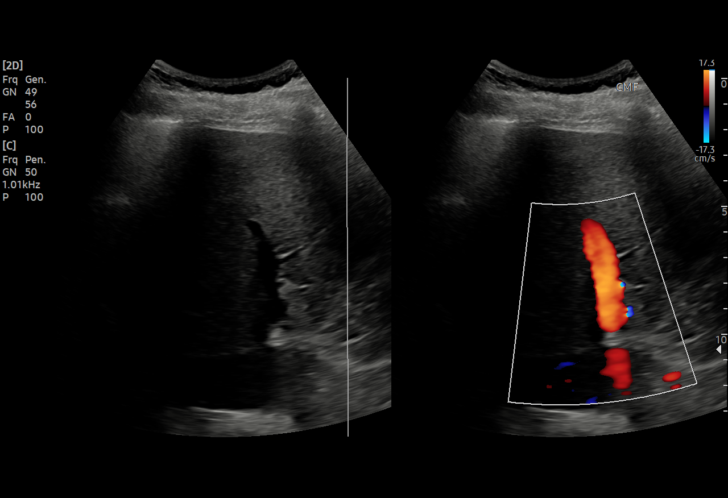

[15 of 25 positions shown; findings below may reference images not displayed]

FINDINGS: Gallbladder:

No gallstones or wall thickening visualized. No sonographic Murphy
sign noted by sonographer.

Common bile duct:

Diameter: 3 mm

Liver:

No focal lesion identified. Within normal limits in parenchymal
echogenicity. Portal vein is patent on color Doppler imaging with
normal direction of blood flow towards the liver.

Other: Note is made of a right renal cyst measuring 3.8 x 4.0 x
cm.
IMPRESSION: No cholelithiasis or sonographic evidence for acute cholecystitis.

## 2024-08-21 ENCOUNTER — Ambulatory Visit: Admitting: Student in an Organized Health Care Education/Training Program

## 2024-08-24 ENCOUNTER — Ambulatory Visit (INDEPENDENT_AMBULATORY_CARE_PROVIDER_SITE_OTHER): Admitting: Student in an Organized Health Care Education/Training Program

## 2024-08-24 ENCOUNTER — Encounter: Payer: Self-pay | Admitting: Student in an Organized Health Care Education/Training Program

## 2024-08-24 VITALS — BP 144/94 | HR 87 | Ht 61.5 in | Wt 166.0 lb

## 2024-08-24 DIAGNOSIS — Z23 Encounter for immunization: Secondary | ICD-10-CM | POA: Diagnosis not present

## 2024-08-24 DIAGNOSIS — F411 Generalized anxiety disorder: Secondary | ICD-10-CM

## 2024-08-24 DIAGNOSIS — Z79899 Other long term (current) drug therapy: Secondary | ICD-10-CM

## 2024-08-24 DIAGNOSIS — Z1231 Encounter for screening mammogram for malignant neoplasm of breast: Secondary | ICD-10-CM

## 2024-08-24 DIAGNOSIS — I10 Essential (primary) hypertension: Secondary | ICD-10-CM | POA: Diagnosis not present

## 2024-08-24 DIAGNOSIS — M19041 Primary osteoarthritis, right hand: Secondary | ICD-10-CM

## 2024-08-24 DIAGNOSIS — M816 Localized osteoporosis [Lequesne]: Secondary | ICD-10-CM

## 2024-08-24 DIAGNOSIS — N1831 Chronic kidney disease, stage 3a: Secondary | ICD-10-CM | POA: Diagnosis not present

## 2024-08-24 DIAGNOSIS — M19042 Primary osteoarthritis, left hand: Secondary | ICD-10-CM

## 2024-08-24 DIAGNOSIS — Z0189 Encounter for other specified special examinations: Secondary | ICD-10-CM

## 2024-08-24 DIAGNOSIS — E7800 Pure hypercholesterolemia, unspecified: Secondary | ICD-10-CM

## 2024-08-24 DIAGNOSIS — Z13820 Encounter for screening for osteoporosis: Secondary | ICD-10-CM

## 2024-08-24 DIAGNOSIS — K219 Gastro-esophageal reflux disease without esophagitis: Secondary | ICD-10-CM

## 2024-08-24 LAB — BASIC METABOLIC PANEL WITH GFR
BUN: 15 mg/dL (ref 6–23)
CO2: 28 meq/L (ref 19–32)
Calcium: 9 mg/dL (ref 8.4–10.5)
Chloride: 100 meq/L (ref 96–112)
Creatinine, Ser: 1.14 mg/dL (ref 0.40–1.20)
GFR: 48.01 mL/min — ABNORMAL LOW (ref >60.00)
Glucose, Bld: 98 mg/dL (ref 70–99)
Potassium: 3.7 meq/L (ref 3.5–5.1)
Sodium: 137 meq/L (ref 135–145)

## 2024-08-24 LAB — LIPID PANEL
Cholesterol: 248 mg/dL — ABNORMAL HIGH (ref 0–200)
HDL: 79.4 mg/dL (ref >39.00)
LDL Cholesterol: 120 mg/dL — ABNORMAL HIGH (ref 0–99)
NonHDL: 168.75
Total CHOL/HDL Ratio: 3
Triglycerides: 244 mg/dL — ABNORMAL HIGH (ref 0.0–149.0)
VLDL: 48.8 mg/dL — ABNORMAL HIGH (ref 0.0–40.0)

## 2024-08-24 LAB — MICROALBUMIN / CREATININE URINE RATIO
Creatinine,U: 89.3 mg/dL
Microalb Creat Ratio: UNDETERMINED mg/g (ref 0.0–30.0)
Microalb, Ur: <0.7 mg/dL

## 2024-08-24 NOTE — Assessment & Plan Note (Signed)
 History of hyperlipidemia.  Not able to tolerate statin in the past.  Currently using red rice yeast extract.  Will check lipids today.

## 2024-08-24 NOTE — Patient Instructions (Signed)
  VISIT SUMMARY: During your visit today, we reviewed your current medications and discussed your ongoing health concerns. Your blood pressure is well-controlled, and we made some adjustments to your medication regimen to better manage your conditions. We also addressed your concerns about anxiety, insomnia, and other chronic issues.  YOUR PLAN: -HYPERTENSION: Hypertension, or high blood pressure, is being managed well with your current medications, amlodipine  and irbesartan . Continue taking these medications as prescribed.  -HYPERLIPIDEMIA: Hyperlipidemia, or high cholesterol, is being managed with red yeast rice due to your intolerance to statins. We will check your cholesterol levels to ensure they are within a healthy range.  -GASTROESOPHAGEAL REFLUX DISEASE (GERD): GERD, or acid reflux, is being managed with Nexium  and famotidine . Since aspirin  can worsen your symptoms, you should stop taking aspirin  and continue with Nexium  and famotidine  as prescribed.  -CHRONIC ANXIETY AND INSOMNIA: Your chronic anxiety and insomnia are being managed with bupropion , Lexapro , and trazodone . For better management of your anxiety, start taking Lexapro  daily. Continue with bupropion  and trazodone  as prescribed.  -PALPITATIONS: Palpitations, or irregular heartbeats, were previously evaluated and found to be normal. You should stop taking metoprolol  and aspirin  as they are not needed.  -OSTEOPOROSIS WITH HISTORY OF RIGHT HUMERUS FRACTURE: Osteoporosis, a condition where bones become weak and brittle, is being managed with raloxifene . Continue taking raloxifene  as prescribed.  -OSTEOARTHRITIS: Osteoarthritis, a type of joint disease that results from the breakdown of joint cartilage, is being managed with ibuprofen as needed. Continue taking ibuprofen when necessary.  -CHRONIC LOW BACK PAIN: Your chronic low back pain is being managed with chiropractic care. Continue with your monthly chiropractic visits and be  cautious with your activities.  INSTRUCTIONS: Please follow up for a cholesterol level check. Continue with your current medications and the new adjustments as discussed. If you experience any new symptoms or have concerns, schedule an appointment.

## 2024-08-24 NOTE — Assessment & Plan Note (Signed)
 I reviewed labs from [facility] in care everywhere.  Last estimated GFR was a little decreased around 58.  She has longstanding hypertension that is not fully-controlled, which puts her at risk of progression of the CKD.  Will check renal function today.  Check Cystatin C for more accurate GFR estimate.  Check urine microalbumin.

## 2024-08-24 NOTE — Assessment & Plan Note (Signed)
 Chronic and stable burden of osteoarthritis in her hands, chronic low back pain that is intermittent.  Goes to chiropractor for that.  Left ankle arthritis was treated with joint replacement surgery 2 years ago.  Overall pretty functional right now.

## 2024-08-24 NOTE — Assessment & Plan Note (Signed)
 Chronic and stable severe reflux.  Currently using Nexium  and famotidine  on a daily basis.  I told her to discontinue the daily aspirin , not needed for primary prevention of ischemic disease.  Might improve her symptoms.  Had endoscopy in 2024 without any evidence of Barrett's.

## 2024-08-24 NOTE — Assessment & Plan Note (Signed)
 Chronic and stable.  Blood pressure above goal today.  She has had trouble increasing antihypertensives in the past due to orthostatic symptoms.  I think you are probably on the highest amount of antihypertensive medications the patient can tolerate.  Plan to continue amlodipine  10 mg daily and irbesartan  75 mg.  She has struggled to increased irbesartan  dose in the past.

## 2024-08-24 NOTE — Progress Notes (Signed)
 New Patient Office Visit  Subjective    Patient ID: Erika Luna, female    DOB: 1950/12/13  Age: 73 y.o. MRN: [id number]  CC:  Chief Complaint  Patient presents with   Establish Care    No concerns  ADHD and anxious     HPI  Discussed the use of AI scribe software for clinical note transcription with the patient, who gave verbal consent to proceed.  History of Present Illness Erika Luna is a 73 year old female who presents for a routine follow-up and medication review.  She has a long-standing history of hypertension, managed with amlodipine  and irbesartan  taken at night. Her blood pressure typically measures around 140/90 mmHg.  She experiences chronic anxiety, a lifelong issue, and currently manages it with Lexapro  as needed and bupropion  daily, finding the latter effective. She constantly feels anxious, with difficulty focusing and remembering things.  She has osteoporosis and a history of a humerus fracture in 2022 following a fall on ice. She has been on raloxifene  for years, having previously tried Fosamax and Prolia. She also has osteopenia and arthritis, managed with ibuprofen as needed.  She experiences acid reflux, managed with Nexium  in the morning and famotidine  at night. Her last endoscopy in 2023 showed a normal esophagus.  She has a history of irregular heartbeats and reports taking a low dose of metoprolol  at night. She has never experienced a heart attack or stroke.  She reports nightmares, which have worsened since her husband's diagnosis. She takes trazodone  nightly to aid sleep and finds it effective.  She has a family history of breast cancer and is due for a mammogram in December. She uses red yeast rice for high cholesterol, as statins increased her arthritis pain.  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 She took a summer off work to recover and has been  doing well since then.  She reports lower back pain and sees a chiropractor monthly. She is concerned about pancreatic cancer due to recent deaths of friends but has no symptoms like jaundice or weight loss.  No recent falls, but she reports balance issues and occasional dizziness.   Outpatient Encounter Medications as of 08/24/2024  Medication Sig   amLODipine  (NORVASC ) 10 MG tablet Take 10 mg by mouth daily.   buPROPion  (WELLBUTRIN  XL) 300 MG 24 hr tablet Take 300 mg by mouth daily.   Cyanocobalamin (B-12 PO) Take 1 tablet by mouth daily.   escitalopram  (LEXAPRO ) 10 MG tablet Take 10 mg by mouth daily.   esomeprazole  (NEXIUM ) 40 MG capsule Take 1 capsule (40 mg total) by mouth daily.   famotidine  (PEPCID ) 20 MG tablet Take 1 tablet (20 mg total) by mouth at bedtime.   irbesartan  (AVAPRO ) 75 MG tablet Take 75 mg by mouth daily.   Olopatadine  HCl (PATADAY  OP) Place 1 drop into both eyes daily.   raloxifene  (EVISTA ) 60 MG tablet Take 60 mg by mouth every evening.   traZODone  (DESYREL ) 100 MG tablet Take 100 mg by mouth at bedtime.   [DISCONTINUED] aspirin  (BAYER ASPIRIN ) 325 MG tablet Take one tablet by mouth daily for 30 days for blood clot prevention. May resume baseline 81mg  aspirin  thereafter   [DISCONTINUED] Calcium-Magnesium -Zinc (CAL-MAG-ZINC PO) Take 1 tablet by mouth every evening.   [DISCONTINUED] metoprolol  tartrate (LOPRESSOR ) 25 MG tablet Take 12.5 mg by mouth at bedtime.   [DISCONTINUED] Red Yeast Rice Extract (RED YEAST  RICE PO) Take 1 capsule by mouth every evening.   [DISCONTINUED] VITAMIN D PO Take 1 capsule by mouth every evening.   [DISCONTINUED] HYDROcodone -acetaminophen  (NORCO/VICODIN) 5-325 MG tablet Take 1-2 tablets every 4-6 hours as needed for pain (Patient not taking: Reported on 08/24/2024)   [DISCONTINUED] ibuprofen (ADVIL) 200 MG tablet Take 600 mg by mouth every 6 (six) hours as needed for moderate pain. (Patient not taking: Reported on 08/24/2024)    [DISCONTINUED] methocarbamol  (ROBAXIN -750) 750 MG tablet Take 1 tablet by mouth every 6-8 hours as needed for spasm (Patient not taking: Reported on 08/24/2024)   No facility-administered encounter medications on file as of 08/24/2024.    Past Medical History:  Diagnosis Date   Anemia    Anxiety    Arthritis    Arthritis of left ankle 11/11/2022   Chronic headaches    Colon polyps    Complication of anesthesia    patient states she does not need much anesthesia, cheap drunk,  slow to wake up from anesthesia   Depression    GERD (gastroesophageal reflux disease)    Hypercholesteremia    Hypertension    MRSA (methicillin resistant staph aureus) culture positive 10/28/2022   at PAT appt - positive MRSA and Staph, mupirocin called in to pharmacy to bid x 5 days prior to surgery.   UC (ulcerative colitis) (HCC)    no current problems as of 10/28/22 per patient    Past Surgical History:  Procedure Laterality Date   ABDOMINAL HYSTERECTOMY     APPENDECTOMY  1981   CESAREAN SECTION     x 2   colonoscopy     LIGAMENT REPAIR Left 11/11/2022   Procedure: POSSIBLE LIGAMENT RECONSTRUCTION;  Surgeon: Elsa Lonni SAUNDERS, MD;  Location: [facility] OR;  Service: Orthopedics;  Laterality: Left;   ORIF HUMERUS FRACTURE Right 12/18/2020   Procedure: OPEN REDUCTION INTERNAL FIXATION (ORIF) PROXIMAL HUMERUS FRACTURE;  Surgeon: Dozier Soulier, MD;  Location: [facility] ORS;  Service: Orthopedics;  Laterality: Right;   SHOULDER SURGERY Right    shoulder surgery Right    to remove screws   surgery to remove scar tissue from endometriosis     TONSILLECTOMY     TOTAL ANKLE ARTHROPLASTY Left 11/11/2022   Procedure: LEFT TOTAL ANKLE ARTHROPLASTY;  Surgeon: Elsa Lonni SAUNDERS, MD;  Location: [facility] OR;  Service: Orthopedics;  Laterality: Left;  LENGHT OF SURGERY: 120 MINUTES   UPPER GI ENDOSCOPY  2023    Family History  Problem Relation Age of Onset   Hypertension Mother    Hypertension Father    Heart  disease Father    Hyperlipidemia Brother    Hypertension Brother    CAD Brother    Peripheral vascular disease Brother    Breast cancer Maternal Aunt    Hepatitis Maternal Aunt        from patient   Breast cancer Maternal Aunt    Prostate cancer Maternal Grandfather    Depression Daughter    Colon cancer Neg Hx    Esophageal cancer Neg Hx    Colon polyps Neg Hx    Pancreatic cancer Neg Hx    Stomach cancer Neg Hx        Objective    BP (!) 144/94   Pulse 87   Ht 5' 1.5 (1.562 m)   Wt 166 lb (75.3 kg)   BMI 30.86 kg/m   Physical Exam  Gen: Well-appearing woman Eyes: Normal Ears: Normal tympanic membranes, normal hearing Neck: Normal thyroid, no nodules or  adenopathy Heart: Regular, 2 out of 6 early stock murmur best heard at the right upper sternal border Lungs: Unlabored, clear throughout Abd: Soft, nontender, no hernias or organomegaly Ext: Warm, no edema, moderate osteoarthritis in bilateral hands affecting the DIP joints Neuro: Alert, conversational, full strength upper and lower extremities Psych: Appropriate mood and affect, not anxious or depressed appearing       Assessment & Plan:   Problem List Items Addressed This Visit       High   Hypertension - Primary (Chronic)   Chronic and stable.  Blood pressure above goal today.  She has had trouble increasing antihypertensives in the past due to orthostatic symptoms.  I think you are probably on the highest amount of antihypertensive medications the patient can tolerate.  Plan to continue amlodipine  10 mg daily and irbesartan  75 mg.  She has struggled to increased irbesartan  dose in the past.      Relevant Orders   Basic metabolic panel with GFR   Microalbumin / creatinine urine ratio   Generalized anxiety disorder (Chronic)   Chronic and stable.  Has had increased caregiver stress with the illness of her husband.  Symptom burden of anxiety is tolerable right now.  We talked about increasing Lexapro  to  daily use medication 10 mg.  Continue with bupropion  and trazodone  at night for insomnia symptoms.      CKD stage 3a, GFR 45-59 ml/min (HCC) (Chronic)   I reviewed labs from [facility] in care everywhere.  Last estimated GFR was a little decreased around 58.  She has longstanding hypertension that is not fully-controlled, which puts her at risk of progression of the CKD.  Will check renal function today.  Check Cystatin C for more accurate GFR estimate.  Check urine microalbumin.      Relevant Orders   Basic metabolic panel with GFR   Microalbumin / creatinine urine ratio   Cystatin C with Glomerular Filtration Rate, Estimated (eGFR)     Medium    Hypercholesteremia (Chronic)   History of hyperlipidemia.  Not able to tolerate statin in the past.  Currently using red rice yeast extract.  Will check lipids today.      Relevant Orders   Lipid panel   Osteoporosis (Chronic)   History of longstanding osteoporosis, fragility fracture of her right humerus in 2022.  She has completed treatment courses with Fosamax and Prolia.  Few years ago was switched over to raloxifene .  No history of VTE.  I think this is a nice choice because she is worried about increased risk of breast cancer due to family history.  Will get a bone density scan, has been over 2 years since her last.  Continue with raloxifene .      Relevant Orders   DG Bone Density     Low   Gastroesophageal reflux disease (Chronic)   Chronic and stable severe reflux.  Currently using Nexium  and famotidine  on a daily basis.  I told her to discontinue the daily aspirin , not needed for primary prevention of ischemic disease.  Might improve her symptoms.  Had endoscopy in 2024 without any evidence of Barrett's.      OA (osteoarthritis) (Chronic)   Chronic and stable burden of osteoarthritis in her hands, chronic low back pain that is intermittent.  Goes to chiropractor for that.  Left ankle arthritis was treated with joint replacement surgery  2 years ago.  Overall pretty functional right now.      Other Visit Diagnoses  Needs flu shot       Relevant Orders   Flu vaccine HIGH DOSE PF(Fluzone Trivalent) (Completed)     Encounter for screening mammogram for breast cancer       Relevant Orders   MM Digital Screening     Encounter for bone density measurement for therapeutic drug monitoring         Encounter for screening for osteoporosis           Return in about 6 months (around 02/22/2025).   Cleatus Debby Specking, MD

## 2024-08-24 NOTE — Assessment & Plan Note (Signed)
 Chronic and stable.  Has had increased caregiver stress with the illness of her husband.  Symptom burden of anxiety is tolerable right now.  We talked about increasing Lexapro  to daily use medication 10 mg.  Continue with bupropion  and trazodone  at night for insomnia symptoms.

## 2024-08-24 NOTE — Assessment & Plan Note (Signed)
 History of longstanding osteoporosis, fragility fracture of her right humerus in 2022.  She has completed treatment courses with Fosamax and Prolia.  Few years ago was switched over to raloxifene .  No history of VTE.  I think this is a nice choice because she is worried about increased risk of breast cancer due to family history.  Will get a bone density scan, has been over 2 years since her last.  Continue with raloxifene .

## 2024-08-27 ENCOUNTER — Ambulatory Visit: Payer: Self-pay | Admitting: Student in an Organized Health Care Education/Training Program

## 2024-08-27 DIAGNOSIS — E7800 Pure hypercholesterolemia, unspecified: Secondary | ICD-10-CM

## 2024-08-27 LAB — CYSTATIN C WITH GLOMERULAR FILTRATION RATE, ESTIMATED (EGFR)
CYSTATIN C: 0.99 mg/L (ref 0.52–1.10)
eGFR: 70 mL/min/1.73m2 (ref >=60)

## 2024-08-28 MED ORDER — ROSUVASTATIN CALCIUM 20 MG PO TABS: 20.0000 mg | ORAL_TABLET | Freq: Every day | ORAL | 3 refills | Status: DC

## 2024-10-16 ENCOUNTER — Ambulatory Visit (HOSPITAL_BASED_OUTPATIENT_CLINIC_OR_DEPARTMENT_OTHER)
Admission: RE | Admit: 2024-10-16 | Discharge: 2024-10-16 | Disposition: A | Source: Ambulatory Visit | Attending: Student in an Organized Health Care Education/Training Program | Admitting: Student in an Organized Health Care Education/Training Program

## 2024-10-16 ENCOUNTER — Encounter (HOSPITAL_BASED_OUTPATIENT_CLINIC_OR_DEPARTMENT_OTHER): Payer: Self-pay

## 2024-10-16 ENCOUNTER — Inpatient Hospital Stay (HOSPITAL_BASED_OUTPATIENT_CLINIC_OR_DEPARTMENT_OTHER)
Admission: RE | Admit: 2024-10-16 | Discharge: 2024-10-16 | Disposition: A | Source: Ambulatory Visit | Attending: Student in an Organized Health Care Education/Training Program

## 2024-10-16 DIAGNOSIS — Z1231 Encounter for screening mammogram for malignant neoplasm of breast: Secondary | ICD-10-CM | POA: Insufficient documentation

## 2024-10-16 DIAGNOSIS — M816 Localized osteoporosis [Lequesne]: Secondary | ICD-10-CM | POA: Insufficient documentation

## 2024-10-17 NOTE — Telephone Encounter (Signed)
 Called patient and scheduled her for 10/24/2024

## 2024-10-24 ENCOUNTER — Ambulatory Visit: Admitting: Student in an Organized Health Care Education/Training Program

## 2024-10-24 ENCOUNTER — Encounter: Payer: Self-pay | Admitting: Student in an Organized Health Care Education/Training Program

## 2024-10-24 VITALS — BP 143/84 | HR 86 | Wt 167.0 lb

## 2024-10-24 DIAGNOSIS — M816 Localized osteoporosis [Lequesne]: Secondary | ICD-10-CM

## 2024-10-24 DIAGNOSIS — F411 Generalized anxiety disorder: Secondary | ICD-10-CM

## 2024-10-24 MED ORDER — ALENDRONATE SODIUM 10 MG PO TABS: 10.0000 mg | ORAL_TABLET | Freq: Every day | ORAL | 3 refills | Status: DC

## 2024-10-24 NOTE — Assessment & Plan Note (Addendum)
 Chronic anxiety and depressed mood worsened over the last couple months with caregiver stress.  She is taking care of her husband who has brain cancer and been struggling with weight loss and low energy.  She has support through her church.  Continues on escitalopram  and trazodone  for sleep.  Reports stable mood.  Will continue to monitor, I offered reassurance and support.  If worsening mood or sleep issues we can adjust the dose of the escitalopram .

## 2024-10-24 NOTE — Patient Instructions (Signed)
°  VISIT SUMMARY: Today, we discussed your osteoporosis and the progression noted in your recent bone density scan, particularly in your right hip. We reviewed your current treatment and made some changes to better manage your condition.  YOUR PLAN: -OSTEOPOROSIS: Osteoporosis is a condition where bones become weak and are more likely to break. Your recent bone density scan showed that your osteoporosis has worsened, especially in your right hip. We have decided to stop raloxifene  and start you on a low-dose of Fosamax (alendronate) daily. Fosamax aims to reduce the risk of hip fractures by about half. Please inform your chiropractor about your osteoporosis diagnosis so they can adjust their treatment accordingly.  INSTRUCTIONS: Please start taking the low-dose Fosamax (alendronate) daily as prescribed. Inform your chiropractor about your osteoporosis diagnosis. Follow up with us  if you experience any side effects or have any concerns about your new medication.

## 2024-10-24 NOTE — Progress Notes (Signed)
° °  Established Patient Office Visit  Patient ID: Erika Luna, female    DOB: 06/11/1951  Age: 73 y.o. MRN: [id number] PCP: Erika Cleatus Ned, MD  Chief Complaint  Patient presents with   Follow-up    Regarding Osteoporosis and mammogram     Subjective:     HPI  Discussed the use of AI scribe software for clinical note transcription with the patient, who gave verbal consent to proceed.  History of Present Illness Erika Luna is a 73 year old female with osteoporosis who presents for a follow-up on her condition.  She has a long-standing history of osteoporosis, initially diagnosed as osteopenia in her forties or fifties. Over the years, she has been on various treatments including Fosamax, which she took for less than five years, and Prolia injections, which she found uncomfortable. Currently, she is on raloxifene . She reports that her recent bone density scan showed osteoporosis, particularly in the right hip, and that it seemed worse compared to her previous scan from 2022.  She experiences a sensation of her right hip 'popping out of joint' and visits a chiropractor monthly for adjustments, which provide temporary relief. She describes the chiropractor as gentle and knowledgeable about her osteoporosis. Additionally, she has a history of her spine 'twisting' and experiences discomfort in her lower back and right heel.  She has a family history of breast cancer, with two aunts affected. She has dense calcifications in her breasts and has had cysts removed in the past. She experiences soreness and occasional sharp pains in her breast area, which she attributes to nerves and stress.  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     Objective:     BP (!) 143/84   Pulse 86   Wt 167 lb (75.8 kg)   BMI 31.04 kg/m   Physical Exam  Gen: Well-appearing woman Heart regular, 2 out of 6 early stock murmur at the right upper  sternal border  Breast: Breast exam requested by the patient, normal with no masses, no axillary lymphadenopathy Psych: Mildly depressed appearing, conversational    Assessment & Plan:   Problem List Items Addressed This Visit       High   Generalized anxiety disorder (Chronic)   Chronic anxiety and depressed mood worsened over the last couple months with caregiver stress.  She is taking care of her husband who has brain cancer and been struggling with weight loss and low energy.  She has support through her church.  Continues on escitalopram  and trazodone  for sleep.  Reports stable mood.  Will continue to monitor, I offered reassurance and support.  If worsening mood or sleep issues we can adjust the dose of the escitalopram .        Medium    Osteoporosis - Primary (Chronic)   There is progression in the right hip despite raloxifene , with a bone density scan showing worsening. Raloxifene  is not first-line, and she prefers oral medication. Discontinued raloxifene  and initiated low-dose Fosamax (alendronate) daily. Educated her on Fosamax's goal to reduce hip fracture risk by about half.  She has normal renal function.  Will plan to continue this for at least 5 years.  Informed her chiropractor of the osteoporosis diagnosis.      Relevant Medications   alendronate (FOSAMAX) 10 MG tablet     Cleatus Luna Jerrell, MD [facility]

## 2024-10-24 NOTE — Assessment & Plan Note (Signed)
 There is progression in the right hip despite raloxifene , with a bone density scan showing worsening. Raloxifene  is not first-line, and she prefers oral medication. Discontinued raloxifene  and initiated low-dose Fosamax (alendronate) daily. Educated her on Fosamax's goal to reduce hip fracture risk by about half.  She has normal renal function.  Will plan to continue this for at least 5 years.  Informed her chiropractor of the osteoporosis diagnosis.

## 2024-11-20 ENCOUNTER — Other Ambulatory Visit: Payer: Self-pay

## 2024-11-21 MED ORDER — FAMOTIDINE 20 MG PO TABS: 20.0000 mg | ORAL_TABLET | Freq: Every day | ORAL | 3 refills | Status: AC

## 2024-11-21 MED ORDER — ESOMEPRAZOLE MAGNESIUM 40 MG PO CPDR: 40.0000 mg | DELAYED_RELEASE_CAPSULE | Freq: Every day | ORAL | 3 refills | Status: AC

## 2024-11-21 MED ORDER — TRAZODONE HCL 100 MG PO TABS: 100.0000 mg | ORAL_TABLET | Freq: Every day | ORAL | 3 refills | Status: AC

## 2024-11-21 MED ORDER — ESCITALOPRAM OXALATE 10 MG PO TABS: 10.0000 mg | ORAL_TABLET | Freq: Every day | ORAL | 3 refills | Status: AC

## 2024-11-21 MED ORDER — IRBESARTAN 75 MG PO TABS: 75.0000 mg | ORAL_TABLET | Freq: Every day | ORAL | 3 refills | Status: AC

## 2024-11-21 MED ORDER — AMLODIPINE BESYLATE 10 MG PO TABS: 10.0000 mg | ORAL_TABLET | Freq: Every day | ORAL | 3 refills | Status: AC

## 2024-11-21 MED ORDER — BUPROPION HCL ER (XL) 300 MG PO TB24: 300.0000 mg | ORAL_TABLET | Freq: Every day | ORAL | 3 refills | Status: AC

## 2024-11-22 ENCOUNTER — Other Ambulatory Visit: Payer: Self-pay | Admitting: Student in an Organized Health Care Education/Training Program

## 2024-11-22 DIAGNOSIS — E7800 Pure hypercholesterolemia, unspecified: Secondary | ICD-10-CM

## 2024-11-22 NOTE — Telephone Encounter (Signed)
 Copied from CRM #[id number]. Topic: Clinical - Medication Refill >> Nov 22, 2024  1:44 PM Taleah C wrote: Medication: rosuvastatin   Has the patient contacted their pharmacy? Yes (Agent: If no, request that the patient contact the pharmacy for the refill. If patient does not wish to contact the pharmacy document the reason why and proceed with request.) (Agent: If yes, when and what did the pharmacy advise?)  This is the patient's preferred pharmacy:   [facility] - [place], [place] - [address] [place] [address] Phone: [phone] Fax: [phone]  Is this the correct pharmacy for this prescription? Yes If no, delete pharmacy and type the correct one.   Has the prescription been filled recently? Yes  Is the patient out of the medication? No  Has the patient been seen for an appointment in the last year OR does the patient have an upcoming appointment? Yes  Can we respond through MyChart? No  Agent: Please be advised that Rx refills may take up to 3 business days. We ask that you follow-up with your pharmacy.

## 2024-11-23 MED ORDER — ROSUVASTATIN CALCIUM 20 MG PO TABS: 20.0000 mg | ORAL_TABLET | Freq: Every day | ORAL | 3 refills | Status: AC

## 2024-12-07 ENCOUNTER — Telehealth: Payer: Self-pay

## 2024-12-07 DIAGNOSIS — M816 Localized osteoporosis [Lequesne]: Secondary | ICD-10-CM

## 2024-12-07 MED ORDER — ALENDRONATE SODIUM 10 MG PO TABS: 10.0000 mg | ORAL_TABLET | Freq: Every day | ORAL | 3 refills | Status: AC

## 2024-12-07 NOTE — Telephone Encounter (Signed)
 Copied from CRM #[id number]. Topic: Clinical - Medication Refill >> Dec 07, 2024 12:33 PM Deleta S wrote: Medication: alendronate  (FOSAMAX ) 10 MG tablet,  metoprolol  tartrate (LOPRESSOR ) 25 MG tablet [[id number]]  Has the patient contacted their pharmacy? No (Agent: If no, request that the patient contact the pharmacy for the refill. If patient does not wish to contact the pharmacy document the reason why and proceed with request.) (Agent: If yes, when and what did the pharmacy advise?)  This is the patient's preferred pharmacy:   [facility] - [place], [place] - [address] [place] [address] Phone: [phone] Fax: [phone]  Is this the correct pharmacy for this prescription? Yes If no, delete pharmacy and type the correct one.   Has the prescription been filled recently? Yes  Is the patient out of the medication? No  Has the patient been seen for an appointment in the last year OR does the patient have an upcoming appointment? Yes  Can we respond through MyChart? No  Agent: Please be advised that Rx refills may take up to 3 business days. We ask that you follow-up with your pharmacy.

## 2024-12-07 NOTE — Addendum Note (Signed)
 Addended by: Maquita Sandoval A on: 12/07/2024 12:51 PM   Modules accepted: Orders

## 2024-12-07 NOTE — Telephone Encounter (Signed)
 Patient is requesting two medications be sent to [facility] mail order. I have pended one but  metoprolol  tartrate (LOPRESSOR ) 25 MG is not on current medication list.   Please advise

## 2025-02-21 ENCOUNTER — Ambulatory Visit

## 2025-02-21 ENCOUNTER — Ambulatory Visit: Admitting: Student in an Organized Health Care Education/Training Program
